# Patient Record
Sex: Female | Born: 1975 | Race: Black or African American | Hispanic: No | Marital: Single | Smoking: Current every day smoker
Health system: Southern US, Community
[De-identification: ages and names within clinical notes are randomized; demographics above are authoritative.]

## PROBLEM LIST (undated history)

## (undated) DIAGNOSIS — K219 Gastro-esophageal reflux disease without esophagitis: Secondary | ICD-10-CM

## (undated) DIAGNOSIS — I10 Essential (primary) hypertension: Secondary | ICD-10-CM

## (undated) HISTORY — PX: CHOLECYSTECTOMY: SHX55

---

## 2006-06-16 ENCOUNTER — Ambulatory Visit: Payer: Self-pay | Admitting: Unknown Physician Specialty

## 2006-08-22 ENCOUNTER — Inpatient Hospital Stay: Payer: Self-pay

## 2008-12-02 ENCOUNTER — Ambulatory Visit: Payer: Self-pay | Admitting: Family Medicine

## 2009-04-20 ENCOUNTER — Inpatient Hospital Stay: Payer: Self-pay | Admitting: Obstetrics and Gynecology

## 2009-04-25 ENCOUNTER — Inpatient Hospital Stay: Payer: Self-pay | Admitting: Obstetrics & Gynecology

## 2011-05-28 ENCOUNTER — Emergency Department (HOSPITAL_COMMUNITY)
Admission: EM | Admit: 2011-05-28 | Discharge: 2011-05-28 | Disposition: A | Discharge status: Home / Self Care | Payer: Medicaid Other | Attending: Emergency Medicine | Admitting: Emergency Medicine

## 2011-05-28 DIAGNOSIS — M79609 Pain in unspecified limb: Secondary | ICD-10-CM | POA: Insufficient documentation

## 2011-05-28 DIAGNOSIS — L02419 Cutaneous abscess of limb, unspecified: Secondary | ICD-10-CM | POA: Insufficient documentation

## 2011-05-28 DIAGNOSIS — L03119 Cellulitis of unspecified part of limb: Secondary | ICD-10-CM | POA: Insufficient documentation

## 2011-05-28 DIAGNOSIS — K219 Gastro-esophageal reflux disease without esophagitis: Secondary | ICD-10-CM | POA: Insufficient documentation

## 2012-02-13 ENCOUNTER — Emergency Department (HOSPITAL_COMMUNITY): Payer: Medicaid Other

## 2012-02-13 ENCOUNTER — Encounter (HOSPITAL_COMMUNITY): Payer: Self-pay | Admitting: Emergency Medicine

## 2012-02-13 ENCOUNTER — Emergency Department (HOSPITAL_COMMUNITY)
Admission: EM | Admit: 2012-02-13 | Discharge: 2012-02-13 | Disposition: A | Discharge status: Home / Self Care | Payer: Medicaid Other | Attending: Emergency Medicine | Admitting: Emergency Medicine

## 2012-02-13 DIAGNOSIS — K920 Hematemesis: Secondary | ICD-10-CM | POA: Insufficient documentation

## 2012-02-13 DIAGNOSIS — F17200 Nicotine dependence, unspecified, uncomplicated: Secondary | ICD-10-CM | POA: Insufficient documentation

## 2012-02-13 DIAGNOSIS — R079 Chest pain, unspecified: Secondary | ICD-10-CM | POA: Insufficient documentation

## 2012-02-13 DIAGNOSIS — K8020 Calculus of gallbladder without cholecystitis without obstruction: Secondary | ICD-10-CM | POA: Insufficient documentation

## 2012-02-13 LAB — COMPREHENSIVE METABOLIC PANEL
AST: 16 U/L (ref 0–37)
Albumin: 4.7 g/dL (ref 3.5–5.2)
Chloride: 101 mEq/L (ref 96–112)
Creatinine, Ser: 0.83 mg/dL (ref 0.50–1.10)
Potassium: 3.5 mEq/L (ref 3.5–5.1)
Total Bilirubin: 0.6 mg/dL (ref 0.3–1.2)
Total Protein: 8.4 g/dL — ABNORMAL HIGH (ref 6.0–8.3)

## 2012-02-13 LAB — CBC
MCHC: 34.1 g/dL (ref 30.0–36.0)
RDW: 14.6 % (ref 11.5–15.5)

## 2012-02-13 LAB — DIFFERENTIAL
Basophils Absolute: 0 10*3/uL (ref 0.0–0.1)
Basophils Relative: 0 % (ref 0–1)
Eosinophils Absolute: 0 10*3/uL (ref 0.0–0.7)
Monocytes Absolute: 0.5 10*3/uL (ref 0.1–1.0)
Neutro Abs: 7.3 10*3/uL (ref 1.7–7.7)
Neutrophils Relative %: 80 % — ABNORMAL HIGH (ref 43–77)

## 2012-02-13 LAB — PREGNANCY, URINE: Preg Test, Ur: NEGATIVE

## 2012-02-13 MED ORDER — GI COCKTAIL ~~LOC~~
ORAL | Status: AC
  Administered 2012-02-13: 17:00:00
  Filled 2012-02-13: qty 30

## 2012-02-13 MED ORDER — LANSOPRAZOLE 30 MG PO CPDR: 30.0000 mg | DELAYED_RELEASE_CAPSULE | Freq: Every day | ORAL | Status: DC

## 2012-02-13 MED ORDER — IOHEXOL 300 MG/ML  SOLN
80.0000 mL | Freq: Once | INTRAMUSCULAR | Status: AC | PRN
  Administered 2012-02-13: 80 mL via INTRAVENOUS

## 2012-02-13 MED ORDER — SODIUM CHLORIDE 0.9 % IV SOLN
INTRAVENOUS | Status: DC
  Administered 2012-02-13: 17:00:00 via INTRAVENOUS

## 2012-02-13 MED ORDER — ONDANSETRON HCL 4 MG/2ML IJ SOLN
4.0000 mg | Freq: Once | INTRAMUSCULAR | Status: AC
  Administered 2012-02-13: 4 mg via INTRAVENOUS
  Filled 2012-02-13: qty 2

## 2012-02-13 MED ORDER — MORPHINE SULFATE 4 MG/ML IJ SOLN
4.0000 mg | Freq: Once | INTRAMUSCULAR | Status: AC
  Administered 2012-02-13: 4 mg via INTRAVENOUS
  Filled 2012-02-13: qty 1

## 2012-02-13 MED ORDER — PANTOPRAZOLE SODIUM 40 MG IV SOLR
40.0000 mg | Freq: Once | INTRAVENOUS | Status: AC
  Administered 2012-02-13: 40 mg via INTRAVENOUS
  Filled 2012-02-13: qty 40

## 2012-02-13 MED ORDER — SODIUM CHLORIDE 0.9 % IV BOLUS (SEPSIS)
500.0000 mL | Freq: Once | INTRAVENOUS | Status: AC
  Administered 2012-02-13: 500 mL via INTRAVENOUS

## 2012-02-13 MED ORDER — ONDANSETRON 8 MG PO TBDP: 8.0000 mg | ORAL_TABLET | Freq: Three times a day (TID) | ORAL | Status: AC | PRN

## 2012-02-13 MED ORDER — OXYCODONE-ACETAMINOPHEN 5-325 MG PO TABS: 1.0000 | ORAL_TABLET | Freq: Four times a day (QID) | ORAL | Status: AC | PRN

## 2012-02-13 NOTE — ED Notes (Signed)
She consumed a Gatorade in route and she has not vomited since that time.

## 2012-02-13 NOTE — ED Notes (Signed)
Patient currently resting quietly in bed; no respiratory or acute distress noted.  Patient updated on plan of care; informed patient that we are currently waiting on disposition/further orders from EDP.  Patient has no other questions or concerns at this time; family present at bedside.  Will continue to monitor.

## 2012-02-13 NOTE — ED Notes (Signed)
Advised physician that patient has reoccurring burning sensation in chest will repeat Zofran.

## 2012-02-13 NOTE — ED Notes (Signed)
Dr Pickering at bedside 

## 2012-02-13 NOTE — ED Provider Notes (Signed)
History     CSN: [id number]  Arrival date & time 02/13/12  1401   First MD Initiated Contact with Patient 02/13/12 1457      Chief Complaint  Patient presents with  . Emesis  . Chest Pain    (Consider location/radiation/quality/duration/timing/severity/associated sxs/prior treatment) Patient is a 36 y.o. female presenting with vomiting and chest pain. The history is provided by the patient.  Emesis  This is a new problem. Pertinent negatives include no abdominal pain, no diarrhea and no headaches.  Chest Pain Primary symptoms include nausea and vomiting. Pertinent negatives for primary symptoms include no shortness of breath and no abdominal pain.  Pertinent negatives for associated symptoms include no numbness and no weakness.    patient developed nausea and vomiting earlier today. She states she vomited a few times of the stomach contents. She states then she began to throw up blood. No diarrhea. She does have some upper abdominal pain and pain in her chest. Described as burning. She states that she has had abdominal pain for last few months. It is worse with eating. She states that she drank about 6 beers last night. Her last period was last month and was normal. She's had no other bleeding. No weight loss.  History reviewed. No pertinent past medical history.  History reviewed. No pertinent past surgical history.  History reviewed. No pertinent family history.  History  Substance Use Topics  . Smoking status: Current Everyday Smoker  . Smokeless tobacco: Not on file  . Alcohol Use: Yes     occasional    OB History    Grav Para Term Preterm Abortions TAB SAB Ect Mult Living                  Review of Systems  Constitutional: Negative for activity change and appetite change.  HENT: Negative for neck stiffness.   Eyes: Negative for pain.  Respiratory: Negative for chest tightness and shortness of breath.   Cardiovascular: Positive for chest pain. Negative for leg  swelling.  Gastrointestinal: Positive for nausea and vomiting. Negative for abdominal pain, diarrhea, blood in stool and anal bleeding.  Genitourinary: Negative for flank pain.  Musculoskeletal: Negative for back pain.  Skin: Negative for rash.  Neurological: Negative for weakness, numbness and headaches.  Psychiatric/Behavioral: Negative for behavioral problems.    Allergies  Review of patient's allergies indicates no known allergies.  Home Medications   Current Outpatient Rx  Name Route Sig Dispense Refill  . RANITIDINE HCL 75 MG PO TABS Oral Take 150 mg by mouth once.    Marland Kitchen GAS-X PO Oral Take 2 tablets by mouth once.    Marland Kitchen LANSOPRAZOLE 30 MG PO CPDR Oral Take 1 capsule (30 mg total) by mouth daily. 14 capsule 0  . ONDANSETRON 8 MG PO TBDP Oral Take 1 tablet (8 mg total) by mouth every 8 (eight) hours as needed for nausea. 20 tablet 0  . OXYCODONE-ACETAMINOPHEN 5-325 MG PO TABS Oral Take 1-2 tablets by mouth every 6 (six) hours as needed for pain. 10 tablet 0    BP 130/80  Pulse 64  Temp(Src) 98.2 F (36.8 C) (Oral)  Resp 21  SpO2 99%  LMP 02/01/2012  Physical Exam  Nursing note and vitals reviewed. Constitutional: She is oriented to person, place, and time. She appears well-developed and well-nourished.  HENT:  Head: Normocephalic and atraumatic.  Eyes: EOM are normal. Pupils are equal, round, and reactive to light.  Neck: Normal range of motion. Neck supple.  Cardiovascular: Normal rate, regular rhythm and normal heart sounds.   No murmur heard. Pulmonary/Chest: Effort normal and breath sounds normal. No respiratory distress. She has no wheezes. She has no rales.  Abdominal: Soft. Bowel sounds are normal. She exhibits no distension. There is no tenderness. There is no rebound and no guarding.  Musculoskeletal: Normal range of motion.  Neurological: She is alert and oriented to person, place, and time. No cranial nerve deficit.  Skin: Skin is warm and dry.  Psychiatric:  She has a normal mood and affect. Her speech is normal.    ED Course  Procedures (including critical care time)  Labs Reviewed  DIFFERENTIAL - Abnormal; Notable for the following:    Neutrophils Relative 80 (*)    All other components within normal limits  COMPREHENSIVE METABOLIC PANEL - Abnormal; Notable for the following:    Total Protein 8.4 (*)    GFR calc non Af Amer 89 (*)    All other components within normal limits  CBC  LIPASE, BLOOD  PREGNANCY, URINE   Dg Chest 2 View  02/13/2012  *RADIOLOGY REPORT*  Clinical Data: Chest pain.  Hematemesis.  Smoker.  CHEST - 2 VIEW 02/13/2012:  Comparison: None.  Findings: Cardiomediastinal silhouette unremarkable.   Lungs clear. Bronchovascular markings normal.  Pulmonary vascularity normal.  No pneumothorax.  No pleural effusions.  Thoracic scoliosis convex right.  IMPRESSION: No acute cardiopulmonary disease.  Original Report Authenticated By: Arnell Sieving, M.D.   Ct Chest W Contrast  02/13/2012  *RADIOLOGY REPORT*  Clinical Data: Vomiting blood.  Burning and chest.  CT CHEST WITH CONTRAST  Technique:  Multidetector CT imaging of the chest was performed following the standard protocol during bolus administration of intravenous contrast.  Contrast: 80mL OMNIPAQUE IOHEXOL 300 MG/ML  SOLN  Comparison: No comparison studies available.  Findings: There is no axillary, mediastinal, or hilar lymphadenopathy.  Soft tissue attenuation in the anterior mediastinum has imaging features compatible with thymic remnant. No thoracic aortic aneurysm.  There is no dissection of the thoracic aorta.  The heart size is normal.  No pericardial or pleural effusion.  No evidence for pneumomediastinum.  There is no esophageal wall thickening.  Lungs are clear bilaterally.  Bone windows reveal no worrisome lytic or sclerotic osseous lesions.Images which include the upper abdomen show a tiny stones within the lumen of the gallbladder.  IMPRESSION: No acute findings in  the chest.  Specifically, no evidence for pneumomediastinum or esophageal wall thickening.  There is no fluid in the mediastinum.  Cholelithiasis.  Original Report Authenticated By: ERIC A. MANSELL, M.D.     1. Chest pain   2. Nausea and vomiting   3. Hematemesis     Date: 02/13/2012  Rate: 61  Rhythm: normal sinus rhythm  QRS Axis: normal  Intervals: normal  ST/T Wave abnormalities: normal  Conduction Disutrbances:none  Narrative Interpretation:   Old EKG Reviewed: none available     MDM  Patient with nausea vomiting epigastric and chest pain. She states she's had some hematemesis. No vomiting she she's arrived here. States she also has severe burning pain. EKG is reassuring. Lab work is also reassuring. Patient continued to have pain and stated she wouldn't be able go home, CT of the chest was done to rule out Boerhaave's. CT did not show any mediastinal air or esophageal thickening. He did show gallstones. She has no right upper quadrant tenderness, but biliary colic could cause some pain. Patient be discharged followup with GI.  Juliet Rude. Rubin Payor, MD 02/13/12 2017

## 2012-02-13 NOTE — ED Notes (Signed)
Pt c/o vomiting that was clear with red tinged starting today; pt sts did drink some ETOH last night; pt sts burning in chest up into neck after vomiting

## 2012-02-13 NOTE — ED Notes (Signed)
Received bedside report from [place], [place].  Patient currently resting quietly in bed; no respiratory or acute distress noted.  Introduced self to patient and updated whiteboard in room.  Patient has no other questions or concerns at this time; will continue to monitor.

## 2012-02-13 NOTE — ED Notes (Signed)
Patient given discharge paperwork; went over discharge instructions with patient.  Patient instructed to take prescriptions as directed, to not drive while taking pain medications, to follow up with GI referral, and to return to the ED for new, worsening, or concerning symptoms.

## 2012-02-13 NOTE — Discharge Instructions (Signed)
Chest Pain (Nonspecific) It is often hard to give a specific diagnosis for the cause of chest pain. There is always a chance that your pain could be related to something serious, such as a heart attack or a blood clot in the lungs. You need to follow up with your caregiver for further evaluation. CAUSES   Heartburn.   Pneumonia or bronchitis.   Anxiety or stress.   Inflammation around your heart (pericarditis) or lung (pleuritis or pleurisy).   A blood clot in the lung.   A collapsed lung (pneumothorax). It can develop suddenly on its own (spontaneous pneumothorax) or from injury (trauma) to the chest.   Shingles infection (herpes zoster virus).  The chest wall is composed of bones, muscles, and cartilage. Any of these can be the source of the pain.  The bones can be bruised by injury.   The muscles or cartilage can be strained by coughing or overwork.   The cartilage can be affected by inflammation and become sore (costochondritis).  DIAGNOSIS  Lab tests or other studies, such as X-rays, electrocardiography, stress testing, or cardiac imaging, may be needed to find the cause of your pain.  TREATMENT   Treatment depends on what may be causing your chest pain. Treatment may include:   Acid blockers for heartburn.   Anti-inflammatory medicine.   Pain medicine for inflammatory conditions.   Antibiotics if an infection is present.   You may be advised to change lifestyle habits. This includes stopping smoking and avoiding alcohol, caffeine, and chocolate.   You may be advised to keep your head raised (elevated) when sleeping. This reduces the chance of acid going backward from your stomach into your esophagus.   Most of the time, nonspecific chest pain will improve within 2 to 3 days with rest and mild pain medicine.  HOME CARE INSTRUCTIONS   If antibiotics were prescribed, take your antibiotics as directed. Finish them even if you start to feel better.   For the next few  days, avoid physical activities that bring on chest pain. Continue physical activities as directed.   Do not smoke.   Avoid drinking alcohol.   Only take over-the-counter or prescription medicine for pain, discomfort, or fever as directed by your caregiver.   Follow your caregiver's suggestions for further testing if your chest pain does not go away.   Keep any follow-up appointments you made. If you do not go to an appointment, you could develop lasting (chronic) problems with pain. If there is any problem keeping an appointment, you must call to reschedule.  SEEK MEDICAL CARE IF:   You think you are having problems from the medicine you are taking. Read your medicine instructions carefully.   Your chest pain does not go away, even after treatment.   You develop a rash with blisters on your chest.  SEEK IMMEDIATE MEDICAL CARE IF:   You have increased chest pain or pain that spreads to your arm, neck, jaw, back, or abdomen.   You develop shortness of breath, an increasing cough, or you are coughing up blood.   You have severe back or abdominal pain, feel nauseous, or vomit.   You develop severe weakness, fainting, or chills.   You have a fever.  THIS IS AN EMERGENCY. Do not wait to see if the pain will go away. Get medical help at once. Call your local emergency services (911 in [place] Do not drive yourself to the hospital. MAKE SURE YOU:   Understand these instructions.     Will watch your condition.   Will get help right away if you are not doing well or get worse.  Document Released: 07/07/2005 Document Revised: 09/16/2011 Document Reviewed: 05/02/2008 [facility] Patient Information 2012 [place], [place].Hematemesis This condition is the vomiting of blood. CAUSES  This can happen if you have a peptic ulcer or an irritation of the throat, stomach, or small bowel. Vomiting over and over again or swallowing blood from a nosebleed, coughing or facial injury can also result in  bloody vomit. Anti-inflammatory pain medicines are a common cause of this potentially dangerous condition. The most serious causes of vomiting blood include:  Ulcers (a bacteria called H. pylori is common cause of ulcers).   Clotting problems.   Alcoholism.   Cirrhosis.  TREATMENT  Treatment depends on the cause and the severity of the bleeding. Small amounts of blood streaks in the vomit is not the same as vomiting large amounts of bloody or dark, coffee grounds-like material. Weakness, fainting, dehydration, anemia, and continued alcohol or drug use increase the risk. Examination may include blood, vomit, or stool tests. The presence of bloody or dark stool that tests positive for blood (Hemoccult) means the bleeding has been going on for some time. Endoscopy and imaging studies may be done. Emergency treatment may include:  IV medicines or fluids.   Blood transfusions.   Surgery.  Hospital care is required for high risk patients or when IV fluids or blood is needed. Upper GI bleeding can cause shock and death if not controlled. HOME CARE INSTRUCTIONS   Your treatment does not require hospital care at this time.   Remain at rest until your condition improves.   Drink clear liquids as tolerated.   Avoid:   Alcohol.   Nicotine.   Aspirin.   Any other anti-inflammatory medicine (ibuprofen, naproxen, and many others).   Medications to suppress stomach acid or vomiting may be needed. Take all your medicine as prescribed.   Be sure to see your caregiver for follow-up as recommended.  SEEK IMMEDIATE MEDICAL CARE IF:   You have repeated vomiting, dehydration, fainting, or extreme weakness.   You are vomiting large amounts of bloody or dark material.   You pass large, dark or bloody stools.  Document Released: 11/04/2004 Document Revised: 09/16/2011 Document Reviewed: 11/20/2008 [facility] Patient Information 2012 [place], [place].Nausea and Vomiting Nausea is a sick feeling  that often comes before throwing up (vomiting). Vomiting is a reflex where stomach contents come out of your mouth. Vomiting can cause severe loss of body fluids (dehydration). Children and elderly adults can become dehydrated quickly, especially if they also have diarrhea. Nausea and vomiting are symptoms of a condition or disease. It is important to find the cause of your symptoms. CAUSES   Direct irritation of the stomach lining. This irritation can result from increased acid production (gastroesophageal reflux disease), infection, food poisoning, taking certain medicines (such as nonsteroidal anti-inflammatory drugs), alcohol use, or tobacco use.   Signals from the brain.These signals could be caused by a headache, heat exposure, an inner ear disturbance, increased pressure in the brain from injury, infection, a tumor, or a concussion, pain, emotional stimulus, or metabolic problems.   An obstruction in the gastrointestinal tract (bowel obstruction).   Illnesses such as diabetes, hepatitis, gallbladder problems, appendicitis, kidney problems, cancer, sepsis, atypical symptoms of a heart attack, or eating disorders.   Medical treatments such as chemotherapy and radiation.   Receiving medicine that makes you sleep (general anesthetic) during surgery.  DIAGNOSIS Your caregiver  may ask for tests to be done if the problems do not improve after a few days. Tests may also be done if symptoms are severe or if the reason for the nausea and vomiting is not clear. Tests may include:  Urine tests.   Blood tests.   Stool tests.   Cultures (to look for evidence of infection).   X-rays or other imaging studies.  Test results can help your caregiver make decisions about treatment or the need for additional tests. TREATMENT You need to stay well hydrated. Drink frequently but in small amounts.You may wish to drink water, sports drinks, clear broth, or eat frozen ice pops or gelatin dessert to help  stay hydrated.When you eat, eating slowly may help prevent nausea.There are also some antinausea medicines that may help prevent nausea. HOME CARE INSTRUCTIONS   Take all medicine as directed by your caregiver.   If you do not have an appetite, do not force yourself to eat. However, you must continue to drink fluids.   If you have an appetite, eat a normal diet unless your caregiver tells you differently.   Eat a variety of complex carbohydrates (rice, wheat, potatoes, bread), lean meats, yogurt, fruits, and vegetables.   Avoid high-fat foods because they are more difficult to digest.   Drink enough water and fluids to keep your urine clear or pale yellow.   If you are dehydrated, ask your caregiver for specific rehydration instructions. Signs of dehydration may include:   Severe thirst.   Dry lips and mouth.   Dizziness.   Dark urine.   Decreasing urine frequency and amount.   Confusion.   Rapid breathing or pulse.  SEEK IMMEDIATE MEDICAL CARE IF:   You have blood or brown flecks (like coffee grounds) in your vomit.   You have black or bloody stools.   You have a severe headache or stiff neck.   You are confused.   You have severe abdominal pain.   You have chest pain or trouble breathing.   You do not urinate at least once every 8 hours.   You develop cold or clammy skin.   You continue to vomit for longer than 24 to 48 hours.   You have a fever.  MAKE SURE YOU:   Understand these instructions.   Will watch your condition.   Will get help right away if you are not doing well or get worse.  Document Released: 09/27/2005 Document Revised: 09/16/2011 Document Reviewed: 02/24/2011 [facility] Patient Information 2012 [place], [place].

## 2012-08-21 ENCOUNTER — Ambulatory Visit: Payer: Self-pay | Admitting: Emergency Medicine

## 2012-08-21 LAB — PREGNANCY, URINE: Pregnancy Test, Urine: NEGATIVE m[IU]/mL

## 2012-08-21 LAB — HEPATIC FUNCTION PANEL A (ARMC)
Albumin: 3.6 g/dL (ref 3.4–5.0)
Alkaline Phosphatase: 70 U/L (ref 50–136)
Bilirubin, Direct: 0.1 mg/dL (ref 0.00–0.20)
Bilirubin,Total: 0.6 mg/dL (ref 0.2–1.0)
Total Protein: 7.8 g/dL (ref 6.4–8.2)

## 2012-08-22 ENCOUNTER — Ambulatory Visit: Payer: Self-pay | Admitting: Emergency Medicine

## 2012-08-23 LAB — PATHOLOGY REPORT

## 2012-08-28 ENCOUNTER — Emergency Department: Payer: Self-pay | Admitting: Emergency Medicine

## 2012-10-07 ENCOUNTER — Emergency Department: Payer: Self-pay | Admitting: Emergency Medicine

## 2012-10-07 LAB — BASIC METABOLIC PANEL
BUN: 7 mg/dL (ref 7–18)
EGFR (Non-African Amer.): >60
Glucose: 89 mg/dL (ref 65–99)
Potassium: 3.2 mmol/L — ABNORMAL LOW (ref 3.5–5.1)
Sodium: 138 mmol/L (ref 136–145)

## 2012-10-07 LAB — CBC
HCT: 39.2 % (ref 35.0–47.0)
HGB: 13.8 g/dL (ref 12.0–16.0)
MCH: 33.2 pg (ref 26.0–34.0)
MCHC: 35.2 g/dL (ref 32.0–36.0)

## 2012-10-08 LAB — URINALYSIS, COMPLETE
Leukocyte Esterase: NEGATIVE
Nitrite: NEGATIVE
Protein: 100
RBC,UR: 2896 /HPF (ref 0–5)
Specific Gravity: 1.034 (ref 1.003–1.030)
WBC UR: 20 /HPF (ref 0–5)

## 2013-02-20 ENCOUNTER — Ambulatory Visit: Payer: Self-pay | Admitting: Neurosurgery

## 2013-05-11 IMAGING — CT CT HEAD WITHOUT CONTRAST
1 series · 16 of 30 positions shown, 20 images · non-contrast
Comparison: none

REASON FOR EXAM: headache and vomiting eval
COMMENTS:

[Series 2: soft tissue · axial · 0.44mm/px · z∈[-232,-98]mm · 16 of 31 slices shown, 20 images]
[im 2/31  brain]
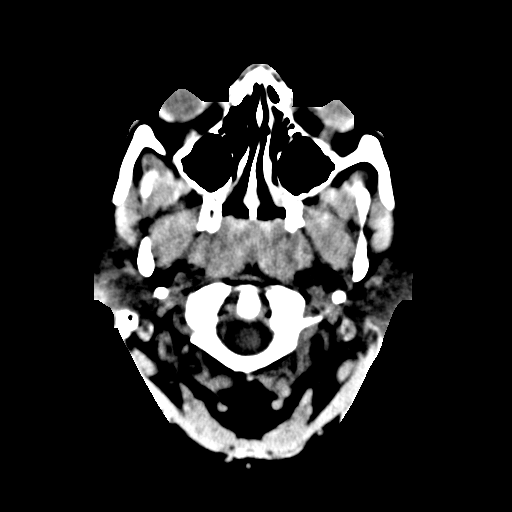
[im 2/31  bone]
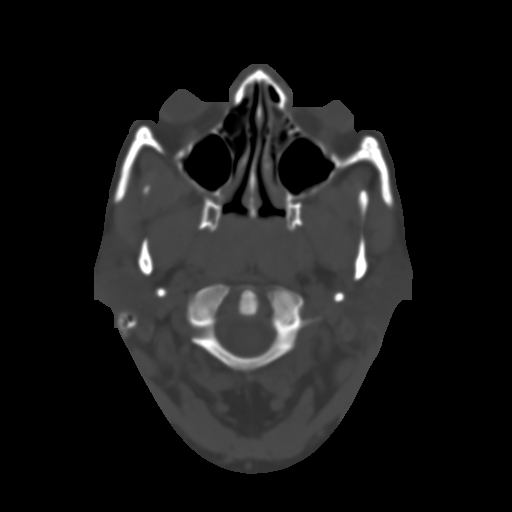
[im 4/31  brain]
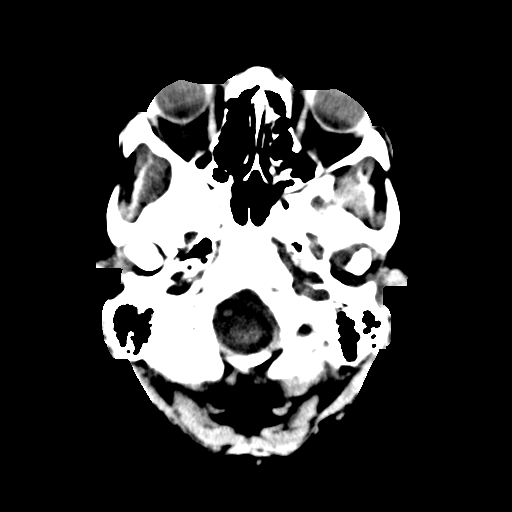
[im 6/31  brain]
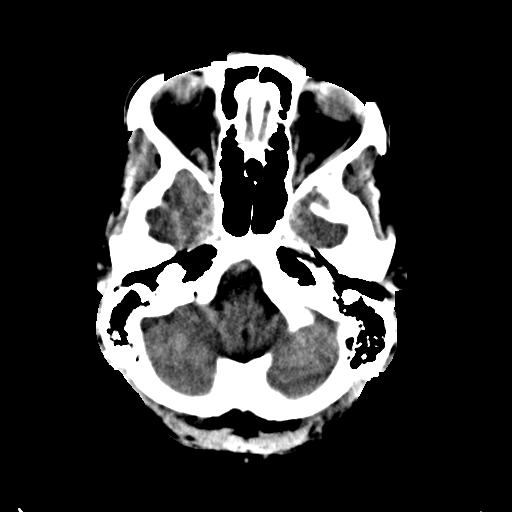
[im 8/31  brain]
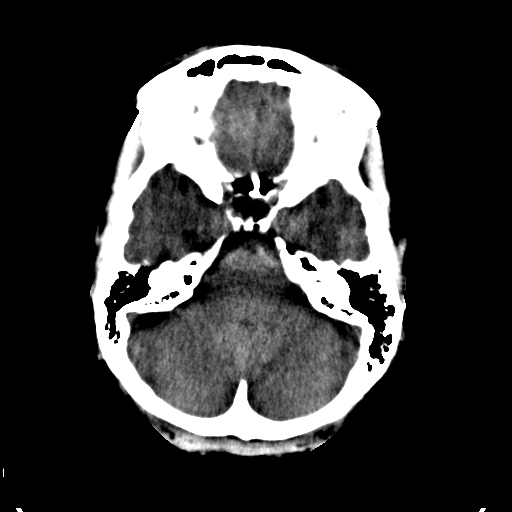
[im 9/31  brain]
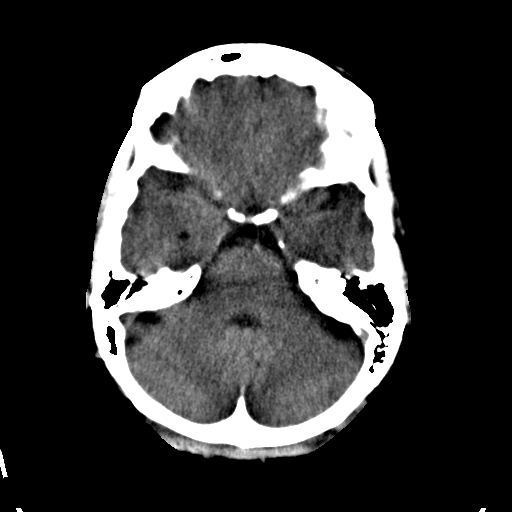
[im 9/31  bone]
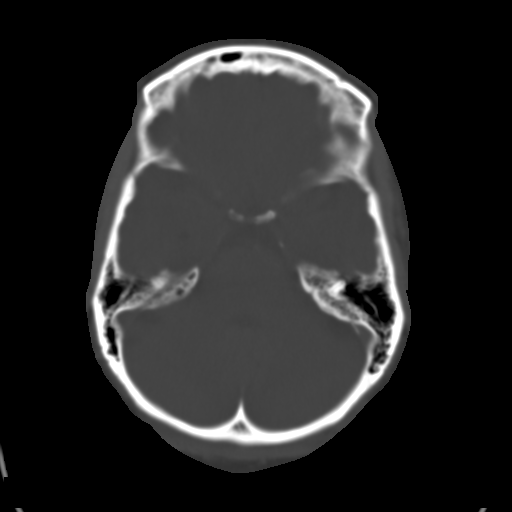
[im 11/31  brain]
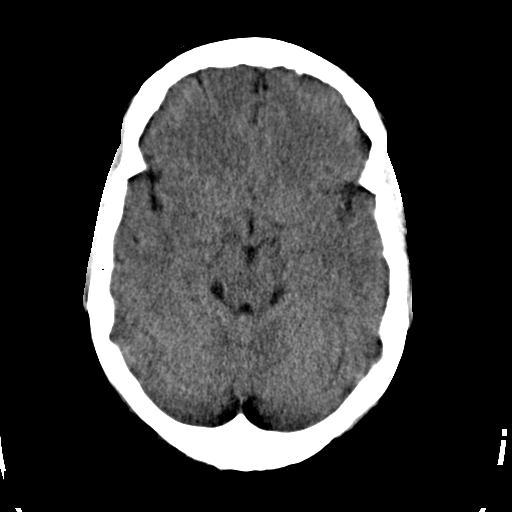
[im 13/31  brain]
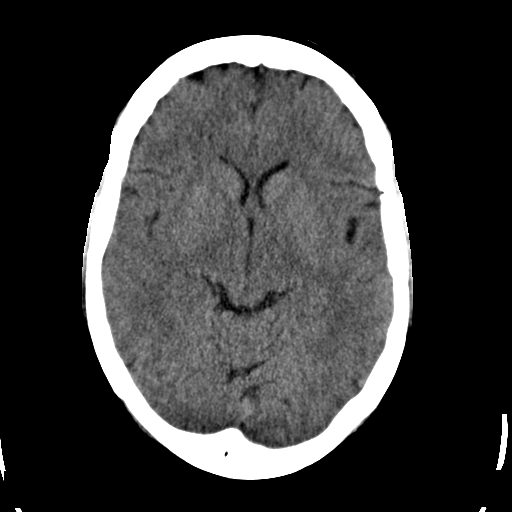
[im 15/31  brain]
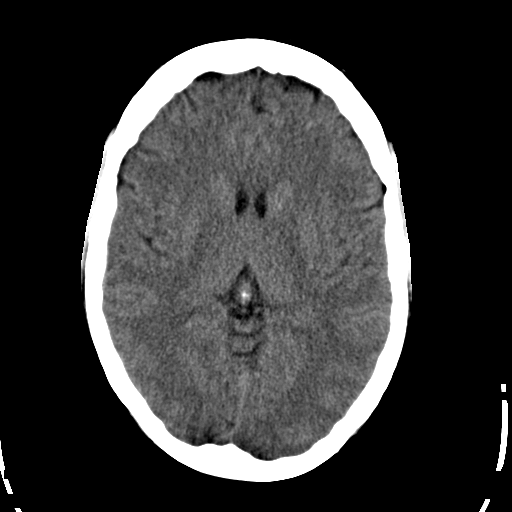
[im 16/31  brain]
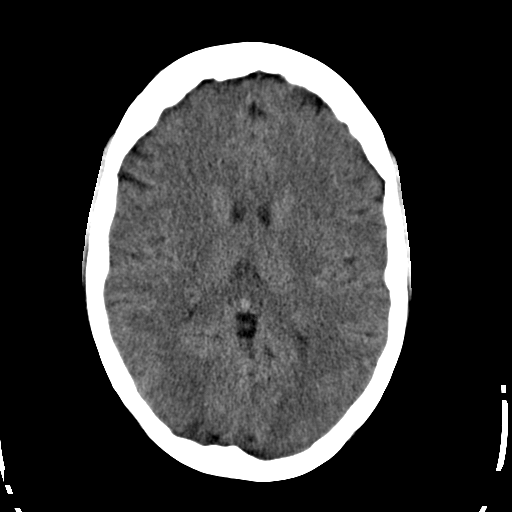
[im 16/31  bone]
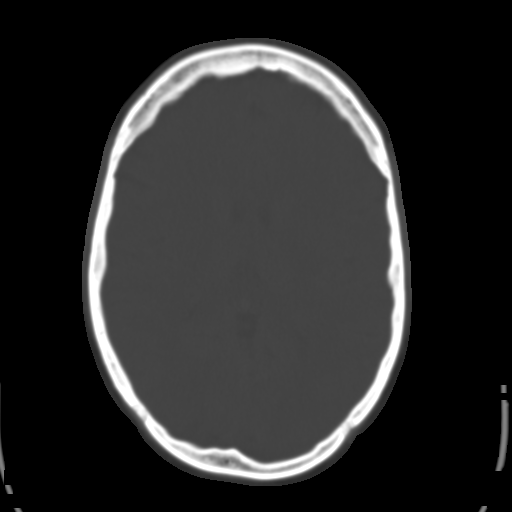
[im 18/31  brain]
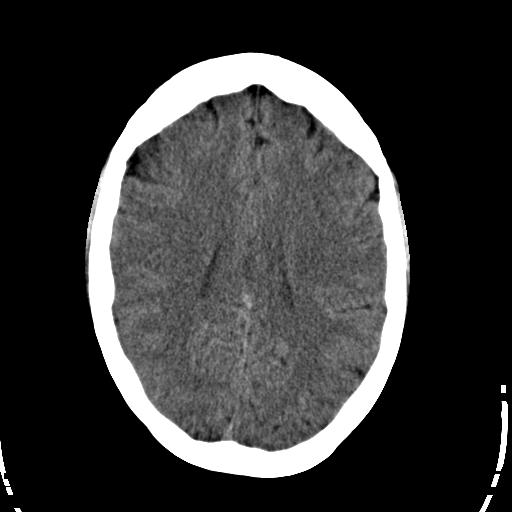
[im 20/31  brain]
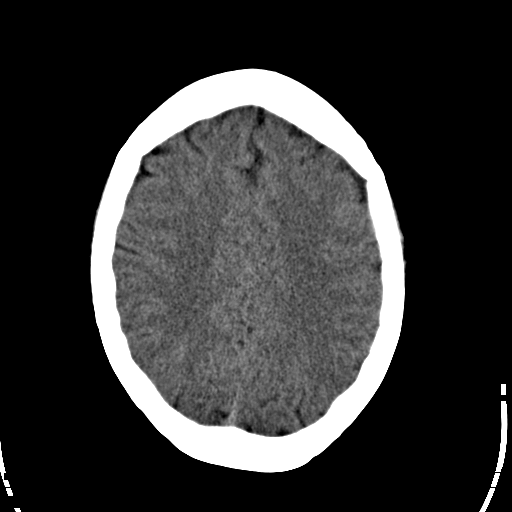
[im 22/31  brain]
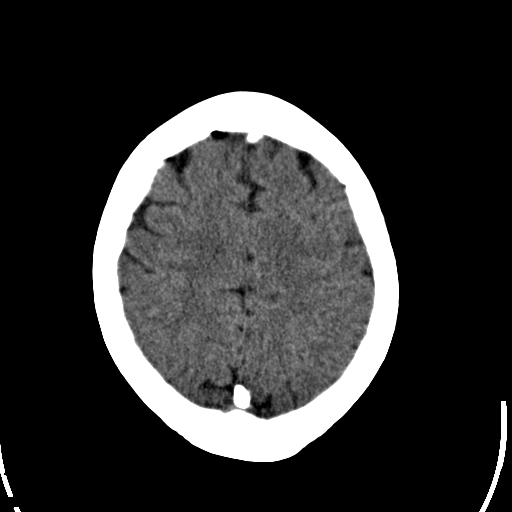
[im 23/31  brain]
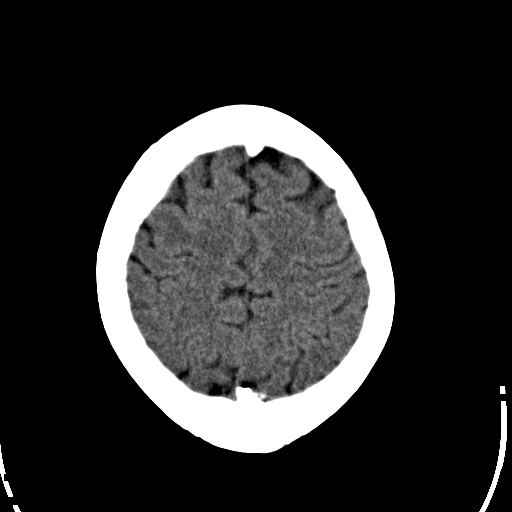
[im 23/31  bone]
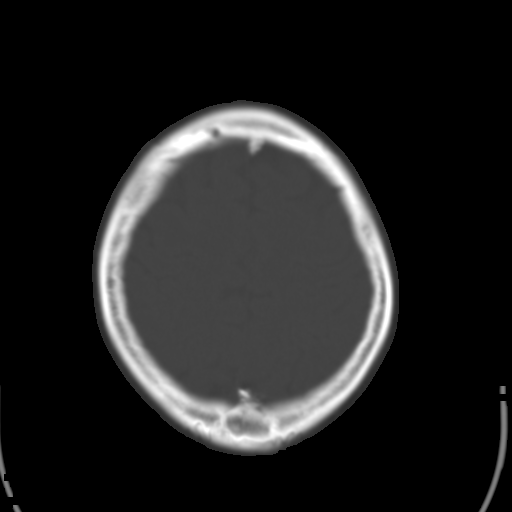
[im 25/31  brain]
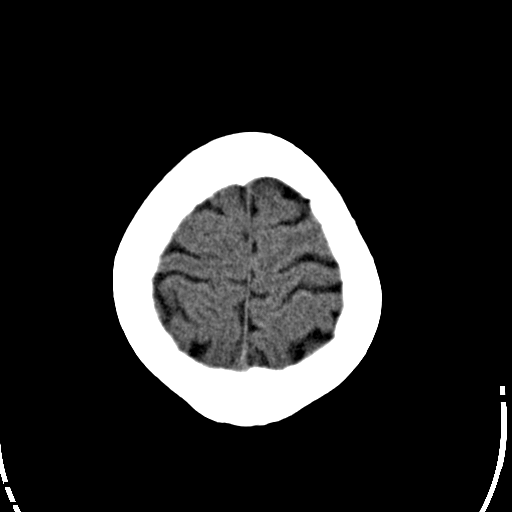
[im 27/31  brain]
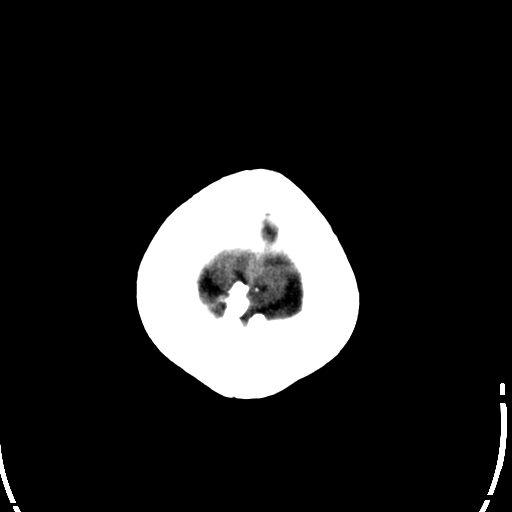
[im 29/31  brain]
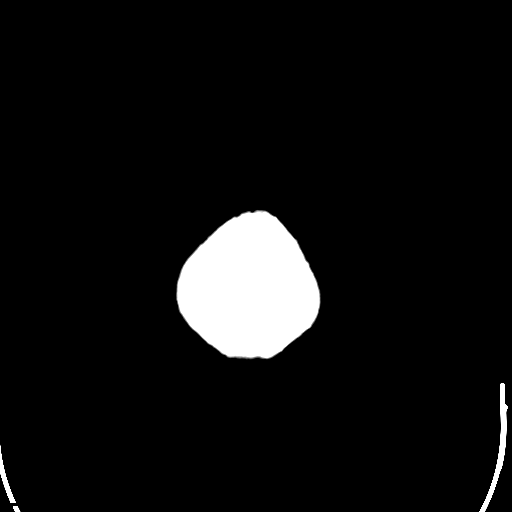

[16 of 30 positions shown; findings below may reference images not displayed]

PROCEDURE:     CT  - CT HEAD WITHOUT CONTRAST  - October 08, 2012  [DATE]

RESULT:     Technique: Helical 5mm sections were obtained from the skull
base to the vertex without administration of intravenous contrast. Note
patient received 100 milliliters of Isovue 4 hours prior to this
examination. The recent administration of intravenous contrast limits
evaluation for small foci of hemorrhage.
FINDINGS: There is not evidence of intra-axial fluid collections. There is
no gross evidence of acute hemorrhage or secondary signs reflecting mass
effect or subacute or chronic focal territorial infarction. The osseous
structures demonstrate no evidence of a depressed skull fracture. If there
is persistent concern clinical follow-up with MRI is recommended. The
mastoid air cells and paranasal sinuses, the visualized portions, are patent.
IMPRESSION: 1. No evidence of acute intracranial abnormalities.

## 2013-05-11 IMAGING — CT CT ABD-PELV W/ CM
1 of 2 series · 15 of 32 positions shown, 19 images · IV contrast (isovue)
Comparison: none

REASON FOR EXAM: (1) vomiting eval obstruction; (2) vomiting eval
obstruction
COMMENTS:

PROCEDURE:     CT  - CT ABDOMEN / PELVIS  W  - October 08, 2012  [DATE]
RESULT:     Technique: Helical 3 mm sections were obtained from the lung
bases through the superior iliac crest status post intravenous
administration of 100 mL Isovue 300.

[Series 2: 3mm soft tissue · axial · 0.68mm/px · z∈[-457,-37]mm · 15 of 154 slices shown, 19 images]
[im 7/154  soft-tissue]
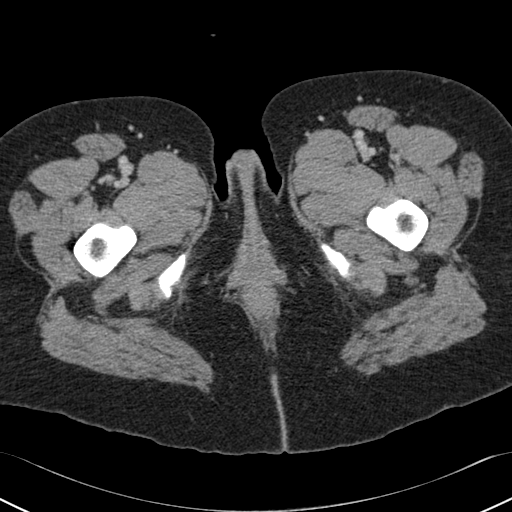
[im 7/154  bone]
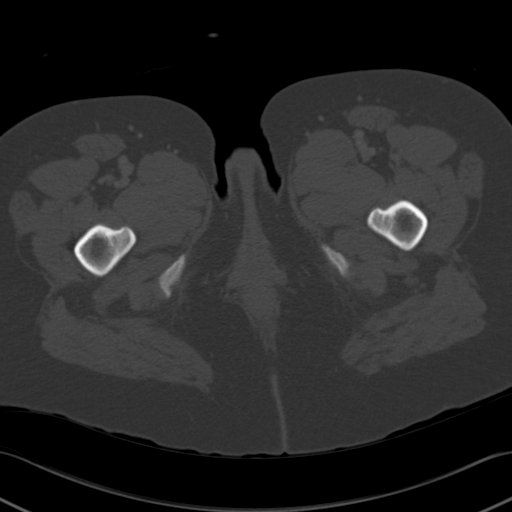
[im 19/154  soft-tissue]
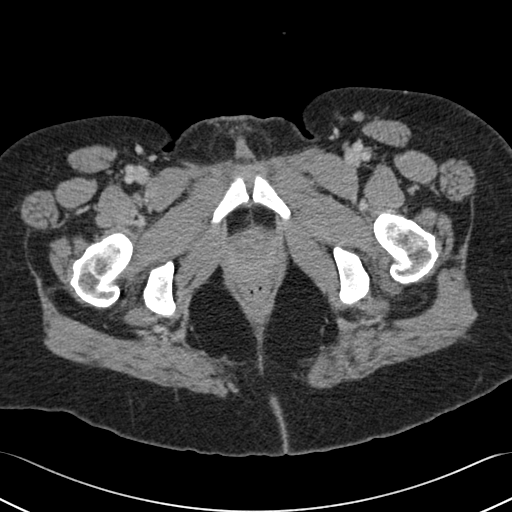
[im 31/154  soft-tissue]
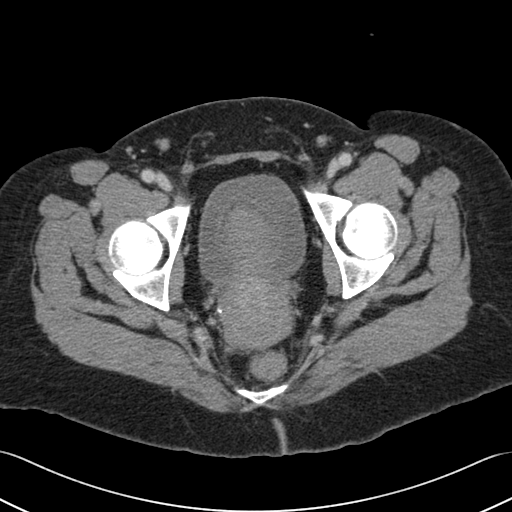
[im 43/154  soft-tissue]
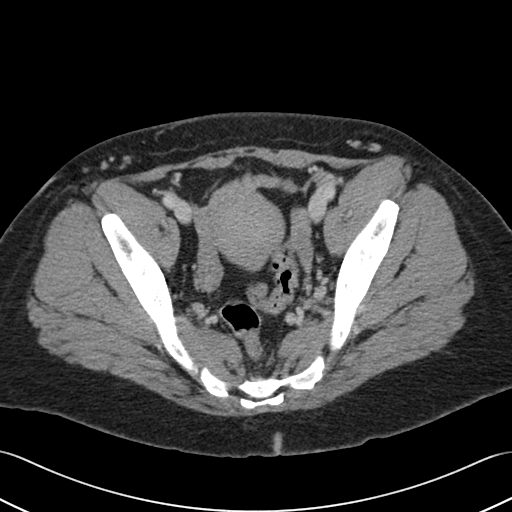
[im 56/154  soft-tissue]
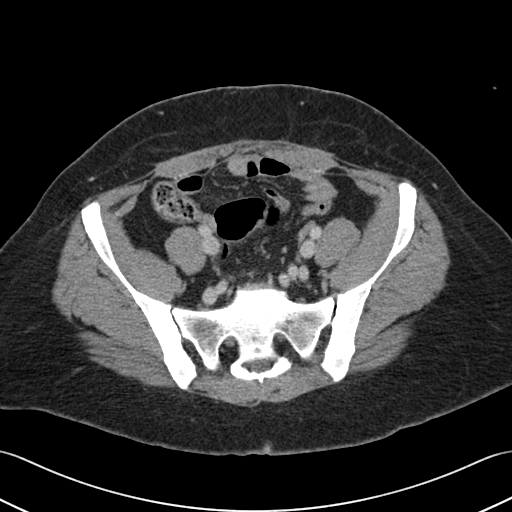
[im 68/154  soft-tissue]
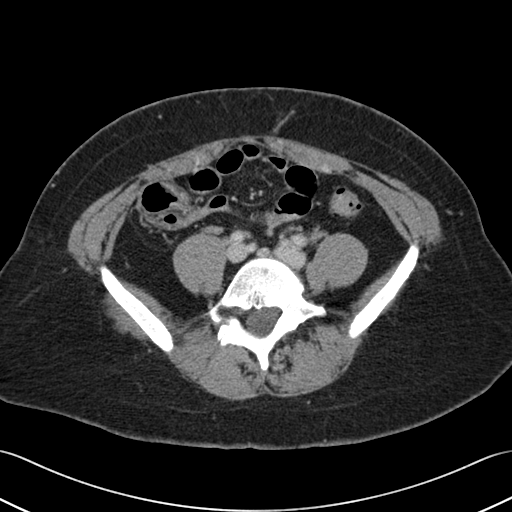
[im 80/154  soft-tissue]
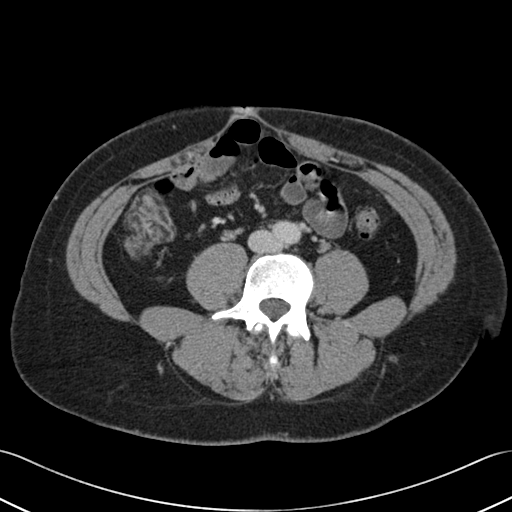
[im 86/154  soft-tissue]
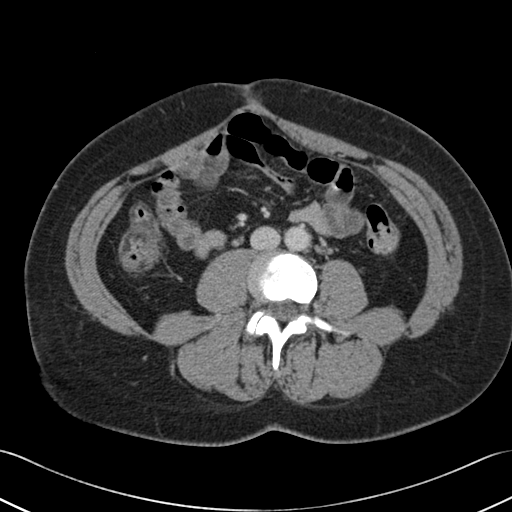
[im 98/154  soft-tissue]
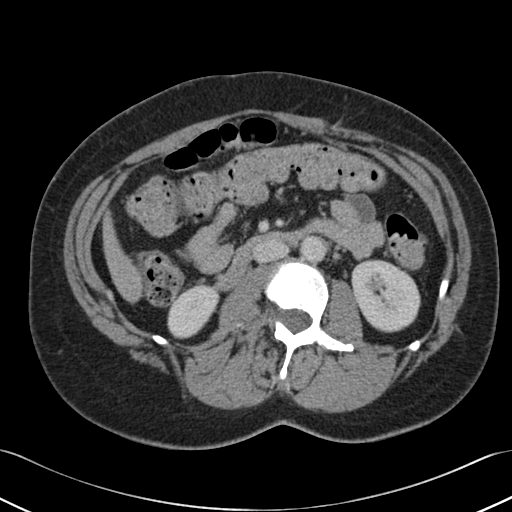
[im 98/154  bone]
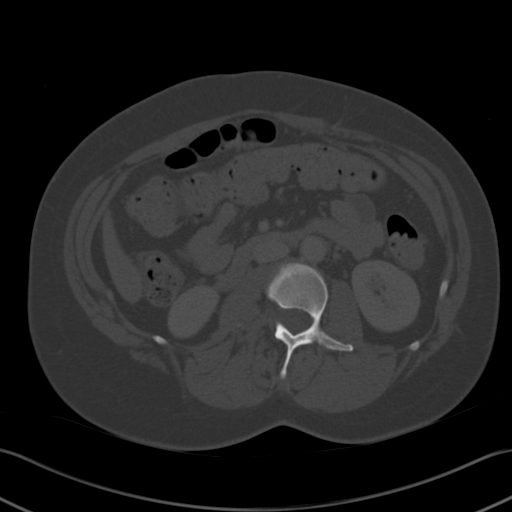
[im 111/154  soft-tissue]
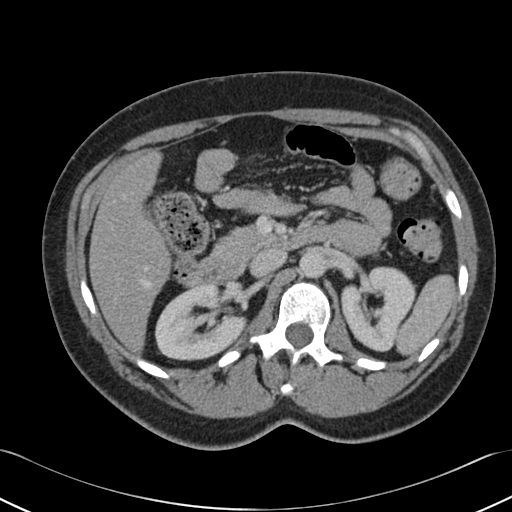
[im 123/154  soft-tissue]
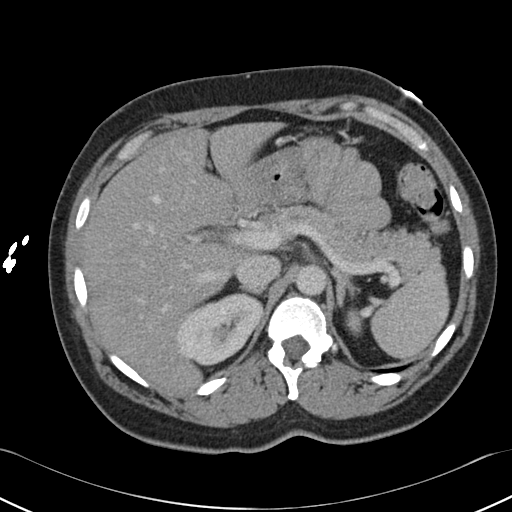
[im 129/154  lung]
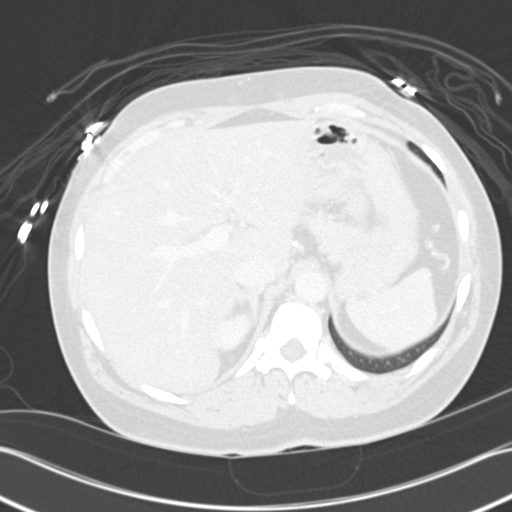
[im 135/154  soft-tissue]
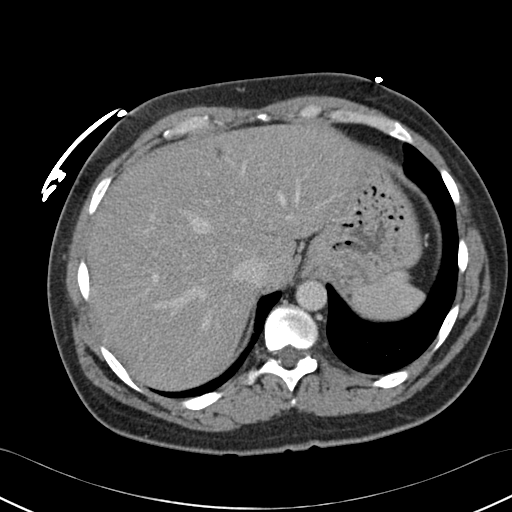
[im 135/154  lung]
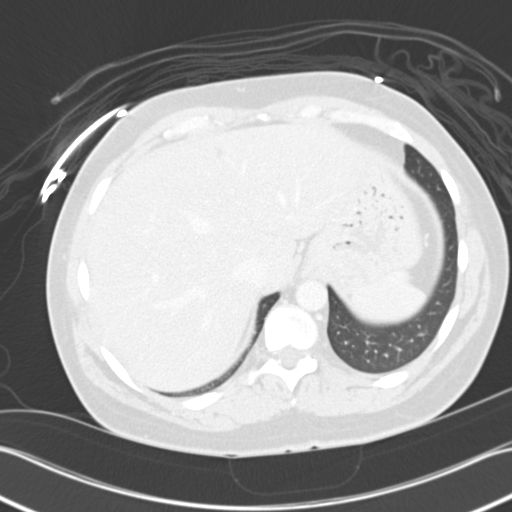
[im 141/154  lung]
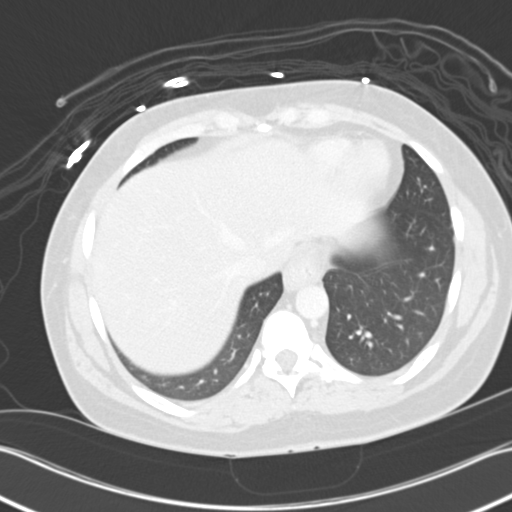
[im 147/154  soft-tissue]
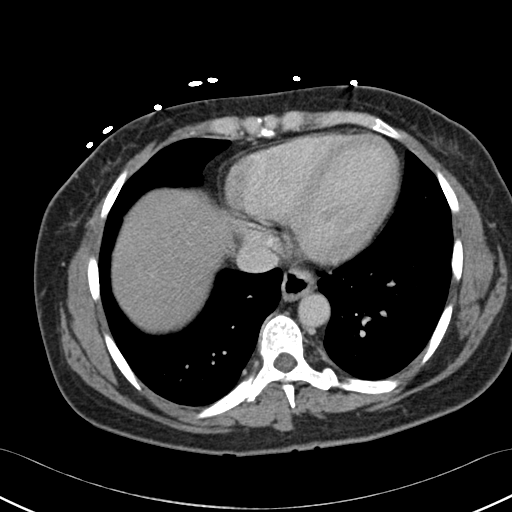
[im 147/154  lung]
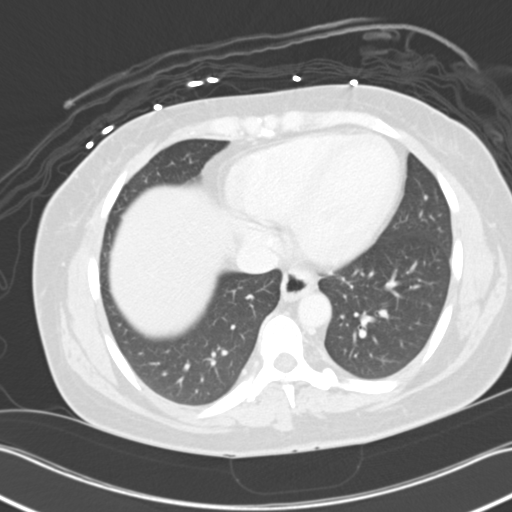

[15 of 32 positions shown; findings below may reference images not displayed]

FINDINGS: The lung bases are unremarkable.

The spleen, adrenals, pancreas, and kidneys are unremarkable. The liver
demonstrates diffuse mild to moderate low-attenuation.

There is not evidence of bowel obstruction, enteritis, colitis nor
diverticulitis.

There is not evidence of an abdominal aortic aneurysm. The celiac, SMA, IMA,
portal vein, and SMV are opacified.

Surgical clips are identified within the gallbladder fossa consistent with
prior cholecystectomy.

There is no evidence of abdominal free fluid, loculated fluid collections,
masses nor adenopathy. Phleboliths are appreciated within the pelvis.
IMPRESSION: No CT evidence of obstructive or inflammatory abnormalities.
2. Hepatic steatosis
3. Dr. Hanchate of the emergency department was informed of these findings
via a preliminary faxed report.

## 2014-07-27 ENCOUNTER — Emergency Department: Payer: Self-pay | Admitting: Emergency Medicine

## 2014-07-27 LAB — URINALYSIS, COMPLETE
Bilirubin,UR: NEGATIVE
GLUCOSE, UR: NEGATIVE mg/dL (ref 0–75)
Ketone: NEGATIVE
Leukocyte Esterase: NEGATIVE
NITRITE: NEGATIVE
PH: 6 (ref 4.5–8.0)
Protein: NEGATIVE
Specific Gravity: 1.002 (ref 1.003–1.030)

## 2014-07-27 LAB — DRUG SCREEN, URINE
Amphetamines, Ur Screen: NEGATIVE (ref ?–1000)
Barbiturates, Ur Screen: NEGATIVE (ref ?–200)
Benzodiazepine, Ur Scrn: NEGATIVE (ref ?–200)
COCAINE METABOLITE, UR ~~LOC~~: NEGATIVE (ref ?–300)
Cannabinoid 50 Ng, Ur ~~LOC~~: POSITIVE (ref ?–50)
MDMA (ECSTASY) UR SCREEN: NEGATIVE (ref ?–500)
METHADONE, UR SCREEN: NEGATIVE (ref ?–300)
Opiate, Ur Screen: NEGATIVE (ref ?–300)
Phencyclidine (PCP) Ur S: NEGATIVE (ref ?–25)
TRICYCLIC, UR SCREEN: NEGATIVE (ref ?–1000)

## 2014-07-27 LAB — COMPREHENSIVE METABOLIC PANEL
ALBUMIN: 2.8 g/dL — AB (ref 3.4–5.0)
ALK PHOS: 52 U/L
ALT: 28 U/L
ANION GAP: 10 (ref 7–16)
AST: 24 U/L (ref 15–37)
BUN: 7 mg/dL (ref 7–18)
Bilirubin,Total: 0.3 mg/dL (ref 0.2–1.0)
CALCIUM: 7.7 mg/dL — AB (ref 8.5–10.1)
CREATININE: 0.92 mg/dL (ref 0.60–1.30)
Chloride: 106 mmol/L (ref 98–107)
Co2: 23 mmol/L (ref 21–32)
GLUCOSE: 71 mg/dL (ref 65–99)
Osmolality: 274 (ref 275–301)
Potassium: 3.5 mmol/L (ref 3.5–5.1)
Sodium: 139 mmol/L (ref 136–145)
Total Protein: 6.8 g/dL (ref 6.4–8.2)

## 2014-07-27 LAB — ACETAMINOPHEN LEVEL: Acetaminophen: <2 ug/mL

## 2014-07-27 LAB — T4, FREE: Free Thyroxine: 0.91 ng/dL (ref 0.76–1.46)

## 2014-07-27 LAB — CBC
HCT: 27.6 % — AB (ref 35.0–47.0)
HGB: 8.7 g/dL — AB (ref 12.0–16.0)
MCH: 25.7 pg — AB (ref 26.0–34.0)
MCHC: 31.3 g/dL — AB (ref 32.0–36.0)
MCV: 82 fL (ref 80–100)
PLATELETS: 252 10*3/uL (ref 150–440)
RBC: 3.37 10*6/uL — ABNORMAL LOW (ref 3.80–5.20)
RDW: 17.7 % — AB (ref 11.5–14.5)
WBC: 5.3 10*3/uL (ref 3.6–11.0)

## 2014-07-27 LAB — TROPONIN I: Troponin-I: <0.02 ng/mL

## 2014-07-27 LAB — SALICYLATE LEVEL: SALICYLATES, SERUM: 2 mg/dL

## 2014-07-27 LAB — PREGNANCY, URINE: Pregnancy Test, Urine: NEGATIVE m[IU]/mL

## 2014-07-27 LAB — TSH: Thyroid Stimulating Horm: 0.282 u[IU]/mL — ABNORMAL LOW

## 2014-07-27 LAB — ETHANOL: Ethanol: 50 mg/dL (ref 0–80)

## 2015-01-28 NOTE — Op Note (Signed)
PATIENT NAME:  Laura Greer, Laura Greer MR#:  [id number] DATE OF BIRTH:  [birth year]  DATE OF PROCEDURE:  08/22/2012  PREOPERATIVE DIAGNOSIS:  Acute cholecystitis.   POSTOPERATIVE DIAGNOSIS: Acute cholecystitis.   PROCEDURE: Laparoscopic cholecystectomy with cholangiogram.   SURGEON: Deandra Goering S. Cecelia ByarsHashmi, MD.   ANESTHESIA:  General.   DESCRIPTION OF PROCEDURE:  The patient had an ultrasound done which showed a lot of stones in the gallbladder. Also, she was having a lot of discomfort in the right upper quadrant of the abdomen. Ultrasound showed numerous stones and the gallbladder was almost packed up with the stones. After the patient was brought to surgery, under general anesthesia, the abdomen was then prepped and draped. A small incision was made above the umbilicus. Another incision was made over the epigastric region. Two 5 mm were then put in the right upper quadrant of the abdomen. The gallbladder was very long and was full of stones and there was one stone stuck at the [facility] area and it was difficult to grasp the gallbladder because the gallbladder was contractured on these stones. First of all, one grasper was used to pull the gallbladder up. The other one on the [facility] pouch on top of this stone. Dissection was done in this area. The patient had a large lymph node which was then dissected. Cystic artery then clipped and cut and one of the branches of the cystic artery bled which I stopped, but we lost a couple of milliliters of blood probably from there, but there was no problem. After applying that clip, we then dissected out the cystic duct. There was a lot of inflammation in the cystic duct and the gallbladder junction area. Also, a large posterior artery was present, so first of all after dissecting out the cystic duct, I did a cholangiogram and the cholangiogram was normal. There was no stone in the common duct. The cystic duct was then clipped and cut and the gallbladder was then removed  from the liver bed without any problem. After that, irrigation of the whole part of the abdomen was done, made sure there was no bleeding, and after that, the gallbladder was removed with a gallbladder bag. The patient had over a couple of hundred stones in the gallbladder. It was difficult to pull out through the bellybutton so I did pull out from the bellybutton. After this was done, the extra clips were removed from the abdomen. All the trocars were then removed under direct vision to make sure there was no bleeding also. Umbilical port was closed with interrupted 0 Vicryl sutures. Staples applied. Marcaine was injected. The patient tolerated the procedure well and was sent to the recovery room in satisfactory condition.   ____________________________ Alton RevereMasud S. Cecelia ByarsHashmi, MD msh:ap D: 08/22/2012 09:13:57 ET T: 08/22/2012 11:06:34 ET JOB#: [id number]  cc: Loza Prell S. Cecelia ByarsHashmi, MD, <Dictator> Margaretann LovelessNeelam S. Khan, MD Meryle ReadyMASUD S Lore Polka MD ELECTRONICALLY SIGNED 09/05/2012 11:05

## 2016-02-02 ENCOUNTER — Other Ambulatory Visit: Payer: Self-pay | Admitting: Nurse Practitioner

## 2016-02-02 DIAGNOSIS — Z1231 Encounter for screening mammogram for malignant neoplasm of breast: Secondary | ICD-10-CM

## 2016-02-19 ENCOUNTER — Ambulatory Visit: Payer: Medicaid Other | Attending: Nurse Practitioner

## 2016-07-06 ENCOUNTER — Emergency Department
Admission: EM | Admit: 2016-07-06 | Discharge: 2016-07-06 | Disposition: A | Discharge status: Home / Self Care | Payer: Medicaid Other | Attending: Emergency Medicine | Admitting: Emergency Medicine

## 2016-07-06 DIAGNOSIS — R202 Paresthesia of skin: Secondary | ICD-10-CM | POA: Diagnosis not present

## 2016-07-06 DIAGNOSIS — I10 Essential (primary) hypertension: Secondary | ICD-10-CM | POA: Insufficient documentation

## 2016-07-06 DIAGNOSIS — R112 Nausea with vomiting, unspecified: Secondary | ICD-10-CM | POA: Insufficient documentation

## 2016-07-06 DIAGNOSIS — F17200 Nicotine dependence, unspecified, uncomplicated: Secondary | ICD-10-CM | POA: Diagnosis not present

## 2016-07-06 HISTORY — DX: Gastro-esophageal reflux disease without esophagitis: K21.9

## 2016-07-06 HISTORY — DX: Essential (primary) hypertension: I10

## 2016-07-06 LAB — URINALYSIS COMPLETE WITH MICROSCOPIC (ARMC ONLY)
BILIRUBIN URINE: NEGATIVE
Bacteria, UA: NONE SEEN
GLUCOSE, UA: NEGATIVE mg/dL
HGB URINE DIPSTICK: NEGATIVE
Ketones, ur: NEGATIVE mg/dL
LEUKOCYTES UA: NEGATIVE
Nitrite: NEGATIVE
Protein, ur: 30 mg/dL — AB
Specific Gravity, Urine: 1.026 (ref 1.005–1.030)
pH: 5 (ref 5.0–8.0)

## 2016-07-06 LAB — CBC
HEMATOCRIT: 34.9 % — AB (ref 35.0–47.0)
Hemoglobin: 11.1 g/dL — ABNORMAL LOW (ref 12.0–16.0)
MCH: 27.6 pg (ref 26.0–34.0)
MCHC: 31.9 g/dL — AB (ref 32.0–36.0)
MCV: 86.5 fL (ref 80.0–100.0)
Platelets: 302 10*3/uL (ref 150–440)
RBC: 4.03 MIL/uL (ref 3.80–5.20)
RDW: 17.5 % — AB (ref 11.5–14.5)
WBC: 4.3 10*3/uL (ref 3.6–11.0)

## 2016-07-06 LAB — COMPREHENSIVE METABOLIC PANEL
ALT: 18 U/L (ref 14–54)
ANION GAP: 7 (ref 5–15)
AST: 22 U/L (ref 15–41)
Albumin: 3.9 g/dL (ref 3.5–5.0)
Alkaline Phosphatase: 50 U/L (ref 38–126)
BILIRUBIN TOTAL: 1.1 mg/dL (ref 0.3–1.2)
BUN: 9 mg/dL (ref 6–20)
CO2: 25 mmol/L (ref 22–32)
Calcium: 9 mg/dL (ref 8.9–10.3)
Chloride: 104 mmol/L (ref 101–111)
Creatinine, Ser: 0.79 mg/dL (ref 0.44–1.00)
GFR calc Af Amer: >60 mL/min (ref >60)
Glucose, Bld: 118 mg/dL — ABNORMAL HIGH (ref 65–99)
POTASSIUM: 3.3 mmol/L — AB (ref 3.5–5.1)
Sodium: 136 mmol/L (ref 135–145)
TOTAL PROTEIN: 7.8 g/dL (ref 6.5–8.1)

## 2016-07-06 LAB — MAGNESIUM: MAGNESIUM: 1.9 mg/dL (ref 1.7–2.4)

## 2016-07-06 LAB — POCT PREGNANCY, URINE: Preg Test, Ur: NEGATIVE

## 2016-07-06 MED ORDER — LORAZEPAM 0.5 MG PO TABS: 0.5000 mg | ORAL_TABLET | Freq: Three times a day (TID) | ORAL | 0 refills | Status: AC | PRN

## 2016-07-06 MED ORDER — LORAZEPAM 1 MG PO TABS
1.0000 mg | ORAL_TABLET | Freq: Once | ORAL | Status: AC
  Administered 2016-07-06: 1 mg via ORAL
  Filled 2016-07-06: qty 1

## 2016-07-06 NOTE — ED Notes (Signed)
Bladder scanned patient at this time per MD request.  Patient has in bladder.  Patient encouraged to attempt to urinate.  Will continue to monitor.

## 2016-07-06 NOTE — ED Triage Notes (Signed)
Pt c/o N/V/D since Sunday with numbness to hands, feet and lips for the past day..Marland Kitchen

## 2016-07-06 NOTE — ED Notes (Signed)
MD at bedside. 

## 2016-07-06 NOTE — ED Notes (Signed)
Patient reports having a headache since Sunday, did go away but she woke with it Monday Morning. Feels better when she lays down.

## 2016-07-06 NOTE — ED Provider Notes (Signed)
[facility] Emergency Department Provider Note  Time seen: 8:23 AM  I have reviewed the triage vital signs and the nursing notes.   HISTORY  Chief Complaint Emesis and Numbness    HPI Armonii L Leckrone is a 40 y.o. female with a past medical history of gastric reflux who presents to the emergency department with symptoms of paresthesias nausea and vomiting. According to the patient for the past 3 days she has felt "off." States she has been feeling anxious at times, gets tingling and numb sensations in her hands, feet and around her mouth. States she has been getting nauseated with occasional episodes of vomiting. Patient denies any history of similar events. Denies any pain such as abdominal pain or chest pain. Denies any headache focal weakness or numbness. Patient states her last period was 2 weeks ago.  Past Medical History:  Diagnosis Date  . Acid reflux   . Hypertension     There are no active problems to display for this patient.   Past Surgical History:  Procedure Laterality Date  . CHOLECYSTECTOMY      Prior to Admission medications   Medication Sig Start Date End Date Taking? Authorizing Provider  lansoprazole (PREVACID) 30 MG capsule Take 1 capsule (30 mg total) by mouth daily. 02/13/12 02/12/13  Benjiman CoreNathan Pickering, MD  ranitidine (ZANTAC) 75 MG tablet Take 150 mg by mouth once.    Historical Provider, MD  Simethicone (GAS-X PO) Take 2 tablets by mouth once.    Historical Provider, MD    Allergies  Allergen Reactions  . Latex Rash    No family history on file.  Social History Social History  Substance Use Topics  . Smoking status: Current Every Day Smoker  . Smokeless tobacco: Never Used  . Alcohol use Yes     Comment: occasional    Review of Systems Constitutional: Negative for fever Cardiovascular: Negative for chest pain. Respiratory: Negative for shortness of breath. Gastrointestinal: Negative for abdominal pain. Positive for  intermittent nausea and vomiting. Negative for diarrhea. Genitourinary: Negative for dysuria. Neurological: Negative for headache 10-point ROS otherwise negative.  ____________________________________________   PHYSICAL EXAM:  VITAL SIGNS: ED Triage Vitals [07/06/16 0806]  Enc Vitals Group     BP (!) 169/109     Pulse Rate (!) 102     Resp 16     Temp 98.1 F (36.7 C)     Temp Source Oral     SpO2 99 %     Weight 170 lb (77.1 kg)     Height 5\' 5"  (1.651 m)     Head Circumference      Peak Flow      Pain Score 8     Pain Loc      Pain Edu?      Excl. in GC?     Constitutional: Alert and oriented. Well appearing and in no distress. Eyes: Normal exam ENT   Head: Normocephalic and atraumatic.   Mouth/Throat: Mucous membranes are moist. Cardiovascular: Normal rate, regular rhythm. No murmur Respiratory: Normal respiratory effort without tachypnea nor retractions. Breath sounds are clear  Gastrointestinal: Soft and nontender. No distention. Musculoskeletal: Nontender with normal range of motion in all extremities. Neurologic:  Normal speech and language. No gross focal neurologic deficits Skin:  Skin is warm, dry and intact.  Psychiatric: Mood and affect are normal.   ____________________________________________    INITIAL IMPRESSION / ASSESSMENT AND PLAN / ED COURSE  Pertinent labs & imaging results that were  available during my care of the patient were reviewed by me and considered in my medical decision making (see chart for details).  The patient presents the emergency department with symptoms of tingling in her hands and feet bilaterally, numbness and tingling sensation around her mouth bilaterally. Patient also states intermittent nausea with occasional episodes of vomiting over the past 3 days. Denies any headache. Denies any focal weakness. Denies any chest or abdominal pain. Denies any fever. Overall the patient appears very well, with a normal physical  and neurological exam currently. We will check labs including electrolytes and magnesium, dose Ativan and closely monitor in the emergency department. Given her normal neurological exam with bilateral symptoms do not suspect CVA.  Labs and urinalysis are negative/within normal limits. Patient looked much better, states she is feeling much better. We'll prescribe a short course of Ativan going home with PCP follow-up. Patient agreeable to plan.  ____________________________________________   FINAL CLINICAL IMPRESSION(S) / ED DIAGNOSES  Paresthesias Nausea vomiting    Minna Antis, MD 07/06/16 1208

## 2016-07-06 NOTE — ED Notes (Signed)
MD at bedside to give patient update on plan of care.

## 2017-11-03 ENCOUNTER — Other Ambulatory Visit: Payer: Self-pay | Admitting: Nurse Practitioner

## 2017-11-03 DIAGNOSIS — Z1231 Encounter for screening mammogram for malignant neoplasm of breast: Secondary | ICD-10-CM

## 2022-06-15 DIAGNOSIS — T2200XS Burn of unspecified degree of shoulder and upper limb, except wrist and hand, unspecified site, sequela: Secondary | ICD-10-CM | POA: Diagnosis not present

## 2022-06-15 DIAGNOSIS — K219 Gastro-esophageal reflux disease without esophagitis: Secondary | ICD-10-CM | POA: Diagnosis not present

## 2022-06-15 DIAGNOSIS — I10 Essential (primary) hypertension: Secondary | ICD-10-CM | POA: Diagnosis not present

## 2022-06-15 DIAGNOSIS — R69 Illness, unspecified: Secondary | ICD-10-CM | POA: Diagnosis not present

## 2022-06-15 DIAGNOSIS — R5383 Other fatigue: Secondary | ICD-10-CM | POA: Diagnosis not present

## 2022-06-15 DIAGNOSIS — E663 Overweight: Secondary | ICD-10-CM | POA: Diagnosis not present

## 2022-06-23 DIAGNOSIS — R5383 Other fatigue: Secondary | ICD-10-CM | POA: Diagnosis not present

## 2022-06-23 DIAGNOSIS — I10 Essential (primary) hypertension: Secondary | ICD-10-CM | POA: Diagnosis not present

## 2022-06-23 DIAGNOSIS — E663 Overweight: Secondary | ICD-10-CM | POA: Diagnosis not present

## 2022-12-31 ENCOUNTER — Telehealth: Payer: Self-pay

## 2022-12-31 NOTE — Telephone Encounter (Signed)
Patient is requesting to get rx for triamcinalone cream called in for a rash she has

## 2023-01-01 ENCOUNTER — Other Ambulatory Visit: Payer: Self-pay | Admitting: Nurse Practitioner

## 2023-01-01 DIAGNOSIS — L309 Dermatitis, unspecified: Secondary | ICD-10-CM

## 2023-01-01 MED ORDER — TRIAMCINOLONE ACETONIDE 0.1 % EX CREA: 1.0000 | TOPICAL_CREAM | Freq: Two times a day (BID) | CUTANEOUS | 0 refills | Status: DC

## 2024-02-21 ENCOUNTER — Encounter: Payer: Self-pay | Admitting: Emergency Medicine

## 2024-02-21 ENCOUNTER — Emergency Department
Admission: EM | Admit: 2024-02-21 | Discharge: 2024-02-21 | Disposition: A | Discharge status: Home / Self Care | Payer: Self-pay | Attending: Emergency Medicine | Admitting: Emergency Medicine

## 2024-02-21 ENCOUNTER — Other Ambulatory Visit: Payer: Self-pay

## 2024-02-21 DIAGNOSIS — I10 Essential (primary) hypertension: Secondary | ICD-10-CM | POA: Insufficient documentation

## 2024-02-21 DIAGNOSIS — T7849XA Other allergy, initial encounter: Secondary | ICD-10-CM

## 2024-02-21 DIAGNOSIS — L234 Allergic contact dermatitis due to dyes: Secondary | ICD-10-CM | POA: Diagnosis present

## 2024-02-21 MED ORDER — DIPHENHYDRAMINE HCL 50 MG/ML IJ SOLN
12.5000 mg | Freq: Once | INTRAMUSCULAR | Status: AC
  Administered 2024-02-21: 12.5 mg via INTRAVENOUS
  Filled 2024-02-21: qty 1

## 2024-02-21 MED ORDER — LOSARTAN POTASSIUM 25 MG PO TABS: 25.0000 mg | ORAL_TABLET | Freq: Every day | ORAL | 0 refills | Status: DC

## 2024-02-21 MED ORDER — PREDNISONE 10 MG (21) PO TBPK: ORAL_TABLET | ORAL | 0 refills | Status: DC

## 2024-02-21 MED ORDER — METHYLPREDNISOLONE SODIUM SUCC 125 MG IJ SOLR
125.0000 mg | Freq: Once | INTRAMUSCULAR | Status: DC
  Filled 2024-02-21: qty 2

## 2024-02-21 MED ORDER — SODIUM CHLORIDE 0.9 % IV BOLUS
1000.0000 mL | Freq: Once | INTRAVENOUS | Status: AC
  Administered 2024-02-21: 1000 mL via INTRAVENOUS

## 2024-02-21 MED ORDER — METHYLPREDNISOLONE SODIUM SUCC 125 MG IJ SOLR
125.0000 mg | Freq: Once | INTRAMUSCULAR | Status: AC
  Administered 2024-02-21: 125 mg via INTRAVENOUS

## 2024-02-21 NOTE — ED Notes (Signed)
 Pt states that they used a hair dye on Saturday (same brand that they've used before but a different formula- advanced when they've used the basic formula before). Pt states that they followed directions, washed their hair out multiple times, but their head feels like it's being stung by lots of bees. Pt also states that their forehead is bigger than normal and that their neck is swollen too. Pt denies any issues with SOB, breathing, or CP.

## 2024-02-21 NOTE — ED Triage Notes (Signed)
 C/O neck and facial swelling this morning.  Patient states is having a reaction to hair color.  AAOx3.  Skin warm and dry. NAD

## 2024-02-21 NOTE — ED Notes (Signed)
 Provider at bedside

## 2024-02-21 NOTE — ED Provider Notes (Signed)
 [facility] Provider Note    Event Date/Time   First MD Initiated Contact with Patient 02/21/24 [phone]     (approximate)   History   Allergic Reaction   HPI  Laura Greer is a 48 y.o. female with history of hypertension, acid reflux and as listed in EMR presents to the emergency department for treatment and evaluation of neck and facial swelling this morning. She used hair dye on Saturday that she has used before, but used a different formula. She feels like her scalp is being bee stung and her forehead is huge on the right side. She also feels like her neck is getting swollen. She denies voice changes, lip or tongue swelling. No shortness of breath.      Physical Exam   Triage Vital Signs: ED Triage Vitals  Encounter Vitals Group     BP 02/21/24 0932 (!) 218/137     Systolic BP Percentile --      Diastolic BP Percentile --      Pulse Rate 02/21/24 0932 76     Resp 02/21/24 0932 16     Temp 02/21/24 0932 98 F (36.7 C)     Temp Source 02/21/24 0932 Oral     SpO2 02/21/24 0932 99 %     Weight 02/21/24 0931 169 lb 15.6 oz (77.1 kg)     Height 02/21/24 0941 5\' 5"  (1.651 m)     Head Circumference --      Peak Flow --      Pain Score 02/21/24 0931 6     Pain Loc --      Pain Education --      Exclude from Growth Chart --     Most recent vital signs: Vitals:   02/21/24 1015 02/21/24 1100  BP: (!) 179/97 (!) 177/96  Pulse: 67 61  Resp:  20  Temp:    SpO2: 98% 96%    General: Awake, no distress.  CV:  Good peripheral perfusion.  Resp:  Normal effort.  Abd:  No distention.  Other:  Right side forehead edematous.  Forehead and neck around scalp line is erythematous.  Airway is patent.   ED Results / Procedures / Treatments   Labs (all labs ordered are listed, but only abnormal results are displayed) Labs Reviewed - No data to display   EKG  Not indicated   RADIOLOGY  Image and radiology report reviewed and interpreted by me.  Radiology report consistent with the same.  Not indicated  PROCEDURES:  Critical Care performed: No  Procedures   MEDICATIONS ORDERED IN ED:  Medications  sodium chloride  0.9 % bolus 1,000 mL (0 mLs Intravenous Stopped 02/21/24 1137)  diphenhydrAMINE (BENADRYL) injection 12.5 mg (12.5 mg Intravenous Given 02/21/24 1029)  methylPREDNISolone sodium succinate (SOLU-MEDROL) 125 mg/2 mL injection 125 mg (125 mg Intravenous Given 02/21/24 1029)     IMPRESSION / MDM / ASSESSMENT AND PLAN / ED COURSE   I have reviewed the triage note.  Differential diagnosis includes, but is not limited to, allergic reaction, anaphylaxis  Patient's presentation is most consistent with acute illness / injury with system symptoms.  48 year old female presenting to the emergency department for treatment and evaluation of swelling and scalp stinging and itching after using hair dye 3 days ago.  See HPI for further details.  On exam, she does appear to have some swelling over the forehead and around the scalp line.  Breath sounds are clear.  Voice is clear.  Vital signs reviewed.  She is hypertensive but asymptomatic.  Patient relates this to the amount of stinging and burning in her scalp stating that this is intensely painful.  Blood pressure will be monitored.  Patient states that she has used Benadryl without relief.  Plan will be to give her some IV Solu-Medrol and fluids.  Symptoms significantly better after fluids have infused and IV Solu-Medrol and Benadryl were given.  Patient feels comfortable with plan of discharge.  Blood pressure has significantly improved as well.  She states that she is supposed to be on losartan but has not had it in several months and her primary care provider is no longer at the same office.  Prescription for 90 days of losartan written and referral for primary care entered.  She will be prescribed a tapered prednisone pack and encouraged to follow-up with her primary care  provider if not gradually improving over the next day or so.  If symptoms rebound and return to prior to arrival she is to return to the emergency department.      FINAL CLINICAL IMPRESSION(S) / ED DIAGNOSES   Final diagnoses:  Allergic reaction to hair dye  Uncontrolled hypertension     Rx / DC Orders   ED Discharge Orders          Ordered    losartan (COZAAR) 25 MG tablet  Daily        02/21/24 1132    predniSONE (STERAPRED UNI-PAK 21 TAB) 10 MG (21) TBPK tablet        02/21/24 1132    Ambulatory Referral to Primary Care (Establish Care)        02/21/24 1132             Note:  This document was prepared using Dragon voice recognition software and may include unintentional dictation errors.   Sherryle Don, FNP 02/21/24 1237    Jacquie Maudlin, MD 02/21/24 [phone]

## 2024-05-31 ENCOUNTER — Telehealth: Payer: Self-pay

## 2024-05-31 NOTE — Telephone Encounter (Signed)
 Copied from CRM #[id number]. Topic: Appointments - Appointment Scheduling >> May 31, 2024 10:47 AM Rosaria BRAVO wrote: Patient/patient representative is calling to schedule an appointment. Refer to attachments for appointment information.   Wants to see Neale Carpen, says that she is a former patient of Chelsa and wants to continue care with her. Please advise

## 2024-05-31 NOTE — Telephone Encounter (Signed)
 Scheduled

## 2024-06-07 ENCOUNTER — Ambulatory Visit: Admitting: Cardiology

## 2024-07-05 ENCOUNTER — Ambulatory Visit: Admitting: Nurse Practitioner

## 2024-10-18 ENCOUNTER — Ambulatory Visit (INDEPENDENT_AMBULATORY_CARE_PROVIDER_SITE_OTHER): Admitting: Cardiology

## 2024-10-18 ENCOUNTER — Encounter: Payer: Self-pay | Admitting: Cardiology

## 2024-10-18 VITALS — BP 174/110 | HR 82 | Ht 65.0 in | Wt 194.0 lb

## 2024-10-18 DIAGNOSIS — Z1211 Encounter for screening for malignant neoplasm of colon: Secondary | ICD-10-CM | POA: Diagnosis not present

## 2024-10-18 DIAGNOSIS — F419 Anxiety disorder, unspecified: Secondary | ICD-10-CM | POA: Insufficient documentation

## 2024-10-18 DIAGNOSIS — Z13220 Encounter for screening for lipoid disorders: Secondary | ICD-10-CM | POA: Diagnosis not present

## 2024-10-18 DIAGNOSIS — Z1329 Encounter for screening for other suspected endocrine disorder: Secondary | ICD-10-CM

## 2024-10-18 DIAGNOSIS — Z131 Encounter for screening for diabetes mellitus: Secondary | ICD-10-CM | POA: Diagnosis not present

## 2024-10-18 DIAGNOSIS — F32A Depression, unspecified: Secondary | ICD-10-CM

## 2024-10-18 DIAGNOSIS — Z1231 Encounter for screening mammogram for malignant neoplasm of breast: Secondary | ICD-10-CM | POA: Diagnosis not present

## 2024-10-18 DIAGNOSIS — I10 Essential (primary) hypertension: Secondary | ICD-10-CM | POA: Diagnosis not present

## 2024-10-18 DIAGNOSIS — K219 Gastro-esophageal reflux disease without esophagitis: Secondary | ICD-10-CM | POA: Insufficient documentation

## 2024-10-18 MED ORDER — BUSPIRONE HCL 5 MG PO TABS: 5.0000 mg | ORAL_TABLET | Freq: Three times a day (TID) | ORAL | 0 refills | Status: DC

## 2024-10-18 MED ORDER — OMEPRAZOLE 40 MG PO CPDR: 40.0000 mg | DELAYED_RELEASE_CAPSULE | Freq: Every day | ORAL | 0 refills | Status: AC

## 2024-10-18 MED ORDER — LOSARTAN POTASSIUM 25 MG PO TABS: 25.0000 mg | ORAL_TABLET | Freq: Every day | ORAL | 0 refills | Status: DC

## 2024-10-18 NOTE — Progress Notes (Signed)
 "  Established Patient Office Visit  Subjective:  Patient ID: Laura Greer, female    DOB: 06/20/1976  Age: 49 y.o. MRN: [id number]  Chief Complaint  Patient presents with   New Patient (Initial Visit)    ReEstablish Care     Patient in office for overdue follow up. Patient doing well over all. Patient has not been seen since 2023, out of her medications. Will restart losartan . Recheck blood pressure in 4 weeks. Due for pap smear, will schedule in at next visit. Due for colon cancer screening, order placed for colonoscopy. Patient fasting, will do lab work today. Due for mammogram, order placed.  Patient GAD 7 elevated, patient interested in seeing psychiatry, referral sent. Will send in buspirone . Will reassess at appointment in 4 weeks.     No other concerns at this time.   Past Medical History:  Diagnosis Date   Acid reflux    Hypertension     Past Surgical History:  Procedure Laterality Date   CHOLECYSTECTOMY      Social History   Socioeconomic History   Marital status: Single    Spouse name: Not on file   Number of children: Not on file   Years of education: Not on file   Highest education level: Not on file  Occupational History   Not on file  Tobacco Use   Smoking status: Every Day   Smokeless tobacco: Never  Substance and Sexual Activity   Alcohol use: Yes    Comment: occasional   Drug use: No   Sexual activity: Yes  Other Topics Concern   Not on file  Social History Narrative   Not on file   Social Drivers of Health   Tobacco Use: High Risk (10/18/2024)   Patient History    Smoking Tobacco Use: Every Day    Smokeless Tobacco Use: Never    Passive Exposure: Not on file  Financial Resource Strain: Not on file  Food Insecurity: Not on file  Transportation Needs: Not on file  Physical Activity: Not on file  Stress: Not on file  Social Connections: Not on file  Intimate Partner Violence: Not on file  Depression (PHQ2-9): Low Risk (10/18/2024)    Depression (PHQ2-9)    PHQ-2 Score: 3  Alcohol Screen: Not on file  Housing: Not on file  Utilities: Not on file  Health Literacy: Not on file    History reviewed. No pertinent family history.  Allergies[1]  Show/hide medication list[2]  Review of Systems  Constitutional: Negative.   HENT: Negative.    Eyes: Negative.   Respiratory: Negative.  Negative for shortness of breath.   Cardiovascular: Negative.  Negative for chest pain.  Gastrointestinal: Negative.  Negative for abdominal pain, constipation and diarrhea.  Genitourinary: Negative.   Musculoskeletal:  Negative for joint pain and myalgias.  Skin: Negative.   Neurological: Negative.  Negative for dizziness and headaches.  Endo/Heme/Allergies: Negative.   All other systems reviewed and are negative.      Objective:   BP (!) 174/110   Pulse 82   Ht 5' 5 (1.651 m)   Wt 194 lb (88 kg)   SpO2 98%   BMI 32.28 kg/m   Vitals:   10/18/24 0934  BP: (!) 174/110  Pulse: 82  Height: 5' 5 (1.651 m)  Weight: 194 lb (88 kg)  SpO2: 98%  BMI (Calculated): 32.28    Physical Exam Vitals and nursing note reviewed.  Constitutional:      Appearance: Normal appearance. She  is normal weight.  HENT:     Head: Normocephalic and atraumatic.     Nose: Nose normal.     Mouth/Throat:     Mouth: Mucous membranes are moist.  Eyes:     Extraocular Movements: Extraocular movements intact.     Conjunctiva/sclera: Conjunctivae normal.     Pupils: Pupils are equal, round, and reactive to light.  Cardiovascular:     Rate and Rhythm: Normal rate and regular rhythm.     Pulses: Normal pulses.     Heart sounds: Normal heart sounds.  Pulmonary:     Effort: Pulmonary effort is normal.     Breath sounds: Normal breath sounds.  Abdominal:     General: Abdomen is flat. Bowel sounds are normal.     Palpations: Abdomen is soft.  Musculoskeletal:        General: Normal range of motion.     Cervical back: Normal range of motion.   Skin:    General: Skin is warm and dry.  Neurological:     General: No focal deficit present.     Mental Status: She is alert and oriented to person, place, and time.  Psychiatric:        Mood and Affect: Mood normal.        Behavior: Behavior normal.        Thought Content: Thought content normal.        Judgment: Judgment normal.       10/18/2024    9:38 AM  GAD 7 : Generalized Anxiety Score  Nervous, Anxious, on Edge 3  Control/stop worrying 3  Worry too much - different things 3  Trouble relaxing 3  Restless 0  Easily annoyed or irritable 1  Afraid - awful might happen 1  Total GAD 7 Score 14  Anxiety Difficulty Somewhat difficult    Flowsheet Row Office Visit from 10/18/2024 in [facility]  Thoughts that you would be better off dead, or of hurting yourself in some way Not at all  PHQ-9 Total Score 3     No results found for any visits on 10/18/24.  No results found for this or any previous visit (from the past 2160 hours).    Assessment & Plan:  Pap smear at next visit Colonoscopy referral sent Mammogram order placed Fasting lab work today Referral sent to psychiatry Restart losartan  Buspirone  for anxiety  Problem List Items Addressed This Visit       Cardiovascular and Mediastinum   Primary hypertension - Primary   Relevant Medications   losartan  (COZAAR ) 25 MG tablet   Other Relevant Orders   CMP14+EGFR     Digestive   Gastroesophageal reflux disease without esophagitis   Relevant Medications   omeprazole  (PRILOSEC) 40 MG capsule     Other   Anxiety and depression   Relevant Medications   busPIRone  (BUSPAR ) 5 MG tablet   Other Relevant Orders   Ambulatory referral to Psychiatry   Other Visit Diagnoses       Diabetes mellitus screening       Relevant Orders   Hemoglobin A1c     Thyroid disorder screening       Relevant Orders   TSH     Lipid screening       Relevant Orders   Lipid Profile     Colon cancer screening        Relevant Orders   Ambulatory referral to Gastroenterology     Breast cancer screening by mammogram  Relevant Orders   MM 3D SCREENING MAMMOGRAM BILATERAL BREAST       Return in about 4 weeks (around 11/15/2024) for b/p recheck, with amanda pap smear.   Total time spent: 25 minutes. This time includes review of previous notes and results and patient face to face interaction during today's visit.    Jeoffrey Pollen, NP  10/18/2024   This document may have been prepared by [facility] Voice Recognition software and as such may include unintentional dictation errors.     [1]  Allergies Allergen Reactions   Latex Rash  [2]  Outpatient Medications Prior to Visit  Medication Sig   [DISCONTINUED] losartan  (COZAAR ) 25 MG tablet Take 1 tablet (25 mg total) by mouth daily.   [DISCONTINUED] omeprazole  (PRILOSEC) 40 MG capsule Take 40 mg by mouth daily.   [DISCONTINUED] predniSONE  (STERAPRED UNI-PAK 21 TAB) 10 MG (21) TBPK tablet Take 6 tablets on the first day and decrease by 1 tablet each day until finished.   [DISCONTINUED] triamcinolone  cream (KENALOG ) 0.1 % Apply 1 Application topically 2 (two) times daily.   No facility-administered medications prior to visit.   "

## 2024-10-19 ENCOUNTER — Ambulatory Visit: Payer: Self-pay | Admitting: Cardiology

## 2024-10-19 LAB — CMP14+EGFR
ALT: 23 IU/L (ref 0–32)
AST: 18 IU/L (ref 0–40)
Albumin: 4.5 g/dL (ref 3.9–4.9)
Alkaline Phosphatase: 87 IU/L (ref 41–116)
BUN/Creatinine Ratio: 10 (ref 9–23)
BUN: 9 mg/dL (ref 6–24)
Bilirubin Total: 0.2 mg/dL (ref 0.0–1.2)
CO2: 21 mmol/L (ref 20–29)
Calcium: 10.1 mg/dL (ref 8.7–10.2)
Chloride: 104 mmol/L (ref 96–106)
Creatinine, Ser: 0.86 mg/dL (ref 0.57–1.00)
Globulin, Total: 3.3 g/dL (ref 1.5–4.5)
Glucose: 66 mg/dL — ABNORMAL LOW (ref 70–99)
Potassium: 4.5 mmol/L (ref 3.5–5.2)
Sodium: 140 mmol/L (ref 134–144)
Total Protein: 7.8 g/dL (ref 6.0–8.5)
eGFR: 82 mL/min/1.73

## 2024-10-19 LAB — LIPID PANEL
Chol/HDL Ratio: 2.3 ratio (ref 0.0–4.4)
Cholesterol, Total: 245 mg/dL — ABNORMAL HIGH (ref 100–199)
HDL: 107 mg/dL
LDL Chol Calc (NIH): 101 mg/dL — ABNORMAL HIGH (ref 0–99)
Triglycerides: 228 mg/dL — ABNORMAL HIGH (ref 0–149)
VLDL Cholesterol Cal: 37 mg/dL (ref 5–40)

## 2024-10-19 LAB — HEMOGLOBIN A1C
Est. average glucose Bld gHb Est-mCnc: 123 mg/dL
Hgb A1c MFr Bld: 5.9 % — ABNORMAL HIGH (ref 4.8–5.6)

## 2024-10-19 LAB — TSH: TSH: 0.532 u[IU]/mL (ref 0.450–4.500)

## 2024-11-08 ENCOUNTER — Ambulatory Visit
Admission: RE | Admit: 2024-11-08 | Discharge: 2024-11-08 | Disposition: A | Discharge status: Home / Self Care | Source: Ambulatory Visit | Attending: Cardiology | Admitting: Cardiology

## 2024-11-08 DIAGNOSIS — Z1231 Encounter for screening mammogram for malignant neoplasm of breast: Secondary | ICD-10-CM | POA: Diagnosis present

## 2024-11-09 ENCOUNTER — Inpatient Hospital Stay
Admission: RE | Admit: 2024-11-09 | Discharge: 2024-11-09 | Disposition: A | Payer: Self-pay | Source: Ambulatory Visit | Attending: Cardiology

## 2024-11-09 ENCOUNTER — Other Ambulatory Visit: Payer: Self-pay | Admitting: *Deleted

## 2024-11-09 DIAGNOSIS — Z1231 Encounter for screening mammogram for malignant neoplasm of breast: Secondary | ICD-10-CM

## 2024-11-11 ENCOUNTER — Other Ambulatory Visit: Payer: Self-pay | Admitting: Cardiology

## 2024-11-15 ENCOUNTER — Encounter: Payer: Self-pay | Admitting: Family

## 2024-11-15 ENCOUNTER — Ambulatory Visit: Admitting: Family

## 2024-11-15 VITALS — BP 152/100 | HR 91 | Ht 65.0 in | Wt 201.0 lb

## 2024-11-15 DIAGNOSIS — Z202 Contact with and (suspected) exposure to infections with a predominantly sexual mode of transmission: Secondary | ICD-10-CM

## 2024-11-15 DIAGNOSIS — N760 Acute vaginitis: Secondary | ICD-10-CM

## 2024-11-15 DIAGNOSIS — Z1151 Encounter for screening for human papillomavirus (HPV): Secondary | ICD-10-CM

## 2024-11-15 DIAGNOSIS — Z1272 Encounter for screening for malignant neoplasm of vagina: Secondary | ICD-10-CM

## 2024-11-15 DIAGNOSIS — Z124 Encounter for screening for malignant neoplasm of cervix: Secondary | ICD-10-CM

## 2024-11-15 MED ORDER — VEOZAH 45 MG PO TABS: 45.0000 mg | ORAL_TABLET | Freq: Every day | ORAL | 5 refills | Status: AC

## 2024-11-15 MED ORDER — PAROXETINE HCL 10 MG PO TABS: 10.0000 mg | ORAL_TABLET | Freq: Every day | ORAL | 0 refills | Status: AC

## 2024-11-15 MED ORDER — LOSARTAN POTASSIUM 50 MG PO TABS: 50.0000 mg | ORAL_TABLET | Freq: Every day | ORAL | 1 refills | Status: AC

## 2024-11-15 NOTE — Progress Notes (Unsigned)
 "  Established Patient Office Visit  Subjective:  Patient ID: Laura Greer, female    DOB: Jul 08, 1976  Age: 49 y.o. MRN: [id number]  Chief Complaint  Patient presents with   Follow-up    4 week follow up, PAP    Patient is here today for her {Time; 1 month to 1 year:14528} follow up.  She has been feeling {DESC; WELL/FAIRLY WELL/POORLY:18703} since last appointment.   She {DOES NOT does:27190} have additional concerns to discuss today.  {apptlabs:33170} She {Needs / does not need:13302} refills.   I have reviewed her {Reviewed:14835} for her appointment today.      No other concerns at this time.   Past Medical History:  Diagnosis Date   Acid reflux    Hypertension     Past Surgical History:  Procedure Laterality Date   BREAST BIOPSY Left    CHOLECYSTECTOMY      Social History   Socioeconomic History   Marital status: Single    Spouse name: Not on file   Number of children: Not on file   Years of education: Not on file   Highest education level: Not on file  Occupational History   Not on file  Tobacco Use   Smoking status: Every Day   Smokeless tobacco: Never  Substance and Sexual Activity   Alcohol use: Yes    Comment: occasional   Drug use: No   Sexual activity: Yes  Other Topics Concern   Not on file  Social History Narrative   Not on file   Social Drivers of Health   Tobacco Use: High Risk (11/15/2024)   Patient History    Smoking Tobacco Use: Every Day    Smokeless Tobacco Use: Never    Passive Exposure: Not on file  Financial Resource Strain: Not on file  Food Insecurity: Not on file  Transportation Needs: Not on file  Physical Activity: Not on file  Stress: Not on file  Social Connections: Not on file  Intimate Partner Violence: Not on file  Depression (PHQ2-9): Low Risk (10/18/2024)   Depression (PHQ2-9)    PHQ-2 Score: 3  Alcohol Screen: Not on file  Housing: Not on file  Utilities: Not on file  Health  Literacy: Not on file    No family history on file.  Allergies[1]  Review of Systems  All other systems reviewed and are negative.      Objective:   BP (!) 152/100   Pulse 91   Ht 5' 5 (1.651 m)   Wt 201 lb (91.2 kg)   LMP 06/22/2016   SpO2 98%   BMI 33.45 kg/m   Vitals:   11/15/24 1105  BP: (!) 152/100  Pulse: 91  Height: 5' 5 (1.651 m)  Weight: 201 lb (91.2 kg)  SpO2: 98%  BMI (Calculated): 33.45    Physical Exam Vitals and nursing note reviewed.  Constitutional:      Appearance: Normal appearance. She is normal weight.  HENT:     Head: Normocephalic.  Eyes:     Extraocular Movements: Extraocular movements intact.     Conjunctiva/sclera: Conjunctivae normal.     Pupils: Pupils are equal, round, and reactive to light.  Cardiovascular:     Rate and Rhythm: Normal rate.  Pulmonary:     Effort: Pulmonary effort is normal.  Neurological:     General: No focal deficit present.     Mental Status: She is alert and oriented to person, place, and time. Mental status is at baseline.  Psychiatric:        Mood and Affect: Mood normal.        Behavior: Behavior normal.        Thought Content: Thought content normal.      No results found for any visits on 11/15/24.  Recent Results (from the past 2160 hours)  CMP14+EGFR     Status: Abnormal   Collection Time: 10/18/24  9:59 AM  Result Value Ref Range   Glucose 66 (L) 70 - 99 mg/dL   BUN 9 6 - 24 mg/dL   Creatinine, Ser 9.13 0.57 - 1.00 mg/dL   eGFR 82 >40 fO/fpw/8.26   BUN/Creatinine Ratio 10 9 - 23   Sodium 140 134 - 144 mmol/L   Potassium 4.5 3.5 - 5.2 mmol/L   Chloride 104 96 - 106 mmol/L   CO2 21 20 - 29 mmol/L   Calcium 10.1 8.7 - 10.2 mg/dL   Total Protein 7.8 6.0 - 8.5 g/dL   Albumin 4.5 3.9 - 4.9 g/dL   Globulin, Total 3.3 1.5 - 4.5 g/dL   Bilirubin Total 0.2 0.0 - 1.2 mg/dL   Alkaline Phosphatase 87 41 - 116 IU/L   AST 18 0 - 40 IU/L   ALT 23 0 - 32 IU/L  Lipid Profile     Status:  Abnormal   Collection Time: 10/18/24  9:59 AM  Result Value Ref Range   Cholesterol, Total 245 (H) 100 - 199 mg/dL   Triglycerides 771 (H) 0 - 149 mg/dL   HDL 892 >60 mg/dL   VLDL Cholesterol Cal 37 5 - 40 mg/dL   LDL Chol Calc (NIH) 898 (H) 0 - 99 mg/dL   Chol/HDL Ratio 2.3 0.0 - 4.4 ratio    Comment:                                   T. Chol/HDL Ratio                                             Men  Women                               1/2 Avg.Risk  3.4    3.3                                   Avg.Risk  5.0    4.4                                2X Avg.Risk  9.6    7.1                                3X Avg.Risk 23.4   11.0   TSH     Status: None   Collection Time: 10/18/24  9:59 AM  Result Value Ref Range   TSH 0.532 0.450 - 4.500 uIU/mL  Hemoglobin A1c     Status: Abnormal   Collection Time: 10/18/24  9:59 AM  Result Value Ref Range   Hgb A1c MFr Bld 5.9 (H) 4.8 -  5.6 %    Comment:          Prediabetes: 5.7 - 6.4          Diabetes: >6.4          Glycemic control for adults with diabetes: <7.0    Est. average glucose Bld gHb Est-mCnc 123 mg/dL       Assessment & Plan:   Assessment & Plan     No follow-ups on file.   Total time spent: {AMA time spent:29001} minutes  ALAN CHRISTELLA ARRANT, FNP  11/15/2024   This document may have been prepared by [facility] Voice Recognition software and as such may include unintentional dictation errors.       [1] Allergies Allergen Reactions   Latex Rash  "

## 2024-12-13 ENCOUNTER — Ambulatory Visit: Admitting: Family
